# Patient Record
Sex: Male | Born: 1985 | Race: Black or African American | Hispanic: No | Marital: Single | Smoking: Current every day smoker
Health system: Southern US, Community
[De-identification: ages and names within clinical notes are randomized; demographics above are authoritative.]

---

## 2007-08-09 ENCOUNTER — Emergency Department: Payer: Self-pay | Admitting: Emergency Medicine

## 2007-08-16 ENCOUNTER — Emergency Department (HOSPITAL_COMMUNITY): Admission: EM | Admit: 2007-08-16 | Discharge: 2007-08-16 | Payer: Self-pay | Admitting: Emergency Medicine

## 2008-10-29 ENCOUNTER — Emergency Department (HOSPITAL_COMMUNITY): Admission: EM | Admit: 2008-10-29 | Discharge: 2008-10-29 | Payer: Self-pay | Admitting: Emergency Medicine

## 2010-09-25 ENCOUNTER — Emergency Department (HOSPITAL_COMMUNITY)
Admission: EM | Admit: 2010-09-25 | Discharge: 2010-09-25 | Disposition: A | Discharge status: Home / Self Care | Payer: Worker's Compensation | Attending: Emergency Medicine | Admitting: Emergency Medicine

## 2010-09-25 DIAGNOSIS — S62639B Displaced fracture of distal phalanx of unspecified finger, initial encounter for open fracture: Secondary | ICD-10-CM | POA: Insufficient documentation

## 2010-09-25 DIAGNOSIS — Y990 Civilian activity done for income or pay: Secondary | ICD-10-CM | POA: Insufficient documentation

## 2010-09-25 DIAGNOSIS — F1210 Cannabis abuse, uncomplicated: Secondary | ICD-10-CM | POA: Insufficient documentation

## 2010-09-25 DIAGNOSIS — W230XXA Caught, crushed, jammed, or pinched between moving objects, initial encounter: Secondary | ICD-10-CM | POA: Insufficient documentation

## 2011-06-11 ENCOUNTER — Encounter (HOSPITAL_COMMUNITY): Payer: Self-pay | Admitting: Emergency Medicine

## 2011-06-11 ENCOUNTER — Emergency Department (HOSPITAL_COMMUNITY)
Admission: EM | Admit: 2011-06-11 | Discharge: 2011-06-12 | Disposition: A | Discharge status: Home / Self Care | Payer: Self-pay | Attending: Emergency Medicine | Admitting: Emergency Medicine

## 2011-06-11 DIAGNOSIS — K6289 Other specified diseases of anus and rectum: Secondary | ICD-10-CM | POA: Insufficient documentation

## 2011-06-11 DIAGNOSIS — K602 Anal fissure, unspecified: Secondary | ICD-10-CM

## 2011-06-11 DIAGNOSIS — F17200 Nicotine dependence, unspecified, uncomplicated: Secondary | ICD-10-CM | POA: Insufficient documentation

## 2011-06-11 NOTE — ED Notes (Signed)
Pt c/o rectal pain x's 3 days.  Thinks he has hemorrhoids

## 2011-06-12 ENCOUNTER — Encounter (HOSPITAL_COMMUNITY): Payer: Self-pay | Admitting: *Deleted

## 2011-06-12 NOTE — Discharge Instructions (Signed)
Anal Fissure, Adult An anal fissure is a small tear or crack in the skin around the anus. Bleeding from a fissure usually stops on its own within a few minutes. However, bleeding will often reoccur with each bowel movement until the crack heals.  CAUSES   Passing large, hard stools.   Frequent diarrheal stools.   Constipation.   Inflammatory bowel disease (Crohn's disease or ulcerative colitis).   Infections.   Anal sex.  SYMPTOMS   Small amounts of blood seen on your stools, on toilet paper, or in the toilet after a bowel movement.   Rectal bleeding.   Painful bowel movements.   Itching or irritation around the anus.  DIAGNOSIS Your caregiver will examine the anal area. An anal fissure can usually be seen with careful inspection. A rectal exam may be performed and a short tube (anoscope) may be used to examine the anal canal. TREATMENT   You may be instructed to take fiber supplements. These supplements can soften your stool to help make bowel movements easier.   Sitz baths may be recommended to help heal the tear. Do not use soap in the sitz baths.   A medicated cream or ointment may be prescribed to lessen discomfort.  HOME CARE INSTRUCTIONS   Maintain a diet high in fruits, whole grains, and vegetables. Avoid constipating foods like bananas and dairy products.   Take sitz baths as directed by your caregiver.   Drink enough fluids to keep your urine clear or pale yellow.   Only take over-the-counter or prescription medicines for pain, discomfort, or fever as directed by your caregiver. Do not take aspirin as this may increase bleeding.   Do not use ointments containing numbing medications (anesthetics) or hydrocortisone. They could slow healing.  SEEK MEDICAL CARE IF:   Your fissure is not completely healed within 3 days.   You have further bleeding.   You have a fever.   You have diarrhea mixed with blood.   You have pain.   Your problem is getting worse  rather than better.  MAKE SURE YOU:   Understand these instructions.   Will watch your condition.   Will get help right away if you are not doing well or get worse.  Document Released: 03/11/2005 Document Revised: 02/28/2011 Document Reviewed: 08/26/2010 [facility] Patient Information 2012 [place], [place].    Metamucil:  One heaping teaspoon in a glass of water three times daily.  Colace (equate stool softener):  Take one 100mg  tablet twice daily.

## 2011-06-12 NOTE — ED Notes (Signed)
Pt reports having a hard time using the bathroom x several days.  Took castro oil that his grandma gave him and has now been having diarrhea.  Reports pain with BM-last BM today.  Bleeding with BM.  Reports that he has hemorrhoids.

## 2011-06-12 NOTE — ED Provider Notes (Signed)
History     CSN: [id number]  Arrival date & time 06/11/11  2051   First MD Initiated Contact with Patient 06/12/11 0106      Chief Complaint  Patient presents with  . Rectal Pain    (Consider location/radiation/quality/duration/timing/severity/associated sxs/prior treatment) HPI Comments: Rectal pain for the past three days.  Having pain with bm's and blood when wiping.  Denies fever or chills.  No constipation.  No abd pain.  The history is provided by the patient.    History reviewed. No pertinent past medical history.  History reviewed. No pertinent past surgical history.  History reviewed. No pertinent family history.  History  Substance Use Topics  . Smoking status: Current Everyday Smoker  . Smokeless tobacco: Not on file  . Alcohol Use: Yes      Review of Systems  All other systems reviewed and are negative.    Allergies  Review of patient's allergies indicates no known allergies.  Home Medications  No current outpatient prescriptions on file.  BP 115/70  Pulse 106  Temp(Src) 99.1 F (37.3 C) (Oral)  Resp 19  SpO2 98%  Physical Exam  Nursing note and vitals reviewed. Constitutional: He is oriented to person, place, and time. He appears well-developed and well-nourished. No distress.  HENT:  Head: Normocephalic and atraumatic.  Neck: Normal range of motion. Neck supple.  Abdominal: Soft. Bowel sounds are normal. He exhibits no distension. There is no tenderness.  Genitourinary:       The rectum appears grossly normal.  I do not see an hemorrhoid or fissure.  There is no active bleeding.  Musculoskeletal: Normal range of motion.  Neurological: He is alert and oriented to person, place, and time.  Skin: Skin is warm and dry.    ED Course  Procedures (including critical care time)  Labs Reviewed - No data to display No results found.   No diagnosis found.    MDM  Most likely a fissure or tear.  Will treat with fiber, stool softeners.   If no better, needs to follow up with pcp.        Geoffery Lyons, MD 06/12/11 [phone]

## 2011-06-14 ENCOUNTER — Emergency Department (HOSPITAL_COMMUNITY): Payer: Self-pay

## 2011-06-14 ENCOUNTER — Encounter (HOSPITAL_COMMUNITY): Payer: Self-pay | Admitting: *Deleted

## 2011-06-14 ENCOUNTER — Emergency Department (HOSPITAL_COMMUNITY)
Admission: EM | Admit: 2011-06-14 | Discharge: 2011-06-15 | Disposition: A | Discharge status: Home / Self Care | Payer: Self-pay | Attending: Emergency Medicine | Admitting: Emergency Medicine

## 2011-06-14 DIAGNOSIS — Z2089 Contact with and (suspected) exposure to other communicable diseases: Secondary | ICD-10-CM | POA: Insufficient documentation

## 2011-06-14 DIAGNOSIS — K921 Melena: Secondary | ICD-10-CM | POA: Insufficient documentation

## 2011-06-14 DIAGNOSIS — K6289 Other specified diseases of anus and rectum: Secondary | ICD-10-CM | POA: Insufficient documentation

## 2011-06-14 DIAGNOSIS — Z79899 Other long term (current) drug therapy: Secondary | ICD-10-CM | POA: Insufficient documentation

## 2011-06-14 DIAGNOSIS — F17200 Nicotine dependence, unspecified, uncomplicated: Secondary | ICD-10-CM | POA: Insufficient documentation

## 2011-06-14 MED ORDER — HYDROMORPHONE HCL PF 1 MG/ML IJ SOLN
2.0000 mg | Freq: Once | INTRAMUSCULAR | Status: AC
  Administered 2011-06-14: 2 mg via INTRAMUSCULAR
  Filled 2011-06-14: qty 2

## 2011-06-14 MED ORDER — LIDOCAINE HCL 2 % EX GEL
Freq: Once | CUTANEOUS | Status: AC
  Administered 2011-06-14: 22:00:00
  Filled 2011-06-14: qty 10

## 2011-06-14 NOTE — ED Notes (Signed)
Pt presents with rectal pain. Pt states has seen blood in his stool before, however has not had a BM in 4 days due to pain and fear of increased pain. Pt lying on abd on stretcher for comfort. Pt unwilling to let RN examine/assess rectum. Unknown external Hemorids.  Pt continues to moan with pain and is very restless at this time. EDP aware.

## 2011-06-14 NOTE — ED Notes (Addendum)
Rectal pain,Seen at ER at [facility].  Wants to be checked for scabies

## 2011-06-15 MED ORDER — PERMETHRIN 5 % EX CREA: TOPICAL_CREAM | CUTANEOUS | Status: AC

## 2011-06-15 MED ORDER — DOCUSATE SODIUM 100 MG PO CAPS: 100.0000 mg | ORAL_CAPSULE | Freq: Two times a day (BID) | ORAL | Status: AC

## 2011-06-15 MED ORDER — HYDROCODONE-ACETAMINOPHEN 5-325 MG PO TABS: 1.0000 | ORAL_TABLET | ORAL | Status: AC | PRN

## 2011-06-15 NOTE — Discharge Instructions (Signed)
You may use the medicines prescribed for your pain as discussed.  Also,  Use the permethrin cream as discussed for your exposure to scabies.  Call Dr Margo Aye for an office visit recheck if your symptoms are not improved over the next several days,  And to establish care with a primary doctor.  Do not drive within  4 hours of taking hydrocodone as this medicine will make you sleepy.

## 2011-06-17 NOTE — ED Provider Notes (Signed)
History     CSN: [id number]  Arrival date & time 06/14/11  2102   First MD Initiated Contact with Patient 06/14/11 2108      Chief Complaint  Patient presents with  . Rectal Pain    (Consider location/radiation/quality/duration/timing/severity/associated sxs/prior treatment) HPI Comments: He presents for pain management of his rectum.  He was seen several days ago at [facility] for the same symptoms which include pain and occasional bleeding with stools.  He has been afraid to have a bm for the past 4 days secondary to pain.  He was placed on fiber and stool softeners,  But has not taken.  He also reports was exposed to scabies last weekend while staying with relatives and would like treatment for this.  He denies rash.    Patient is a 26 y.o. male presenting with hematochezia. The history is provided by the patient.  Rectal Bleeding  The current episode started 5 to 7 days ago. The stool is described as streaked with blood. There was no prior successful therapy. Associated symptoms include rectal pain. Pertinent negatives include no anorexia, no fever, no abdominal pain, no diarrhea, no hematemesis, no nausea, no vomiting, no hematuria, no chest pain, no headaches and no rash. He has been eating and drinking normally. His past medical history does not include abdominal surgery, inflammatory bowel disease or recent abdominal injury. Recently, medical care has been given at another facility.    History reviewed. No pertinent past medical history.  History reviewed. No pertinent past surgical history.  History reviewed. No pertinent family history.  History  Substance Use Topics  . Smoking status: Current Everyday Smoker  . Smokeless tobacco: Not on file  . Alcohol Use: Yes      Review of Systems  Constitutional: Negative for fever.  HENT: Negative for congestion, sore throat and neck pain.   Eyes: Negative.   Respiratory: Negative for chest tightness and shortness of breath.     Cardiovascular: Negative for chest pain.  Gastrointestinal: Positive for blood in stool, hematochezia and rectal pain. Negative for nausea, vomiting, abdominal pain, diarrhea, anorexia and hematemesis.  Genitourinary: Negative.  Negative for hematuria, flank pain, difficulty urinating and penile pain.  Musculoskeletal: Negative for joint swelling and arthralgias.  Skin: Negative.  Negative for rash and wound.  Neurological: Negative for dizziness, weakness, light-headedness, numbness and headaches.  Hematological: Negative.   Psychiatric/Behavioral: Negative.     Allergies  Review of patient's allergies indicates no known allergies.  Home Medications   Current Outpatient Rx  Name Route Sig Dispense Refill  . DOCUSATE SODIUM 100 MG PO CAPS Oral Take 1 capsule (100 mg total) by mouth every 12 (twelve) hours. 60 capsule 0  . HYDROCODONE-ACETAMINOPHEN 5-325 MG PO TABS Oral Take 1 tablet by mouth every 4 (four) hours as needed for pain. 15 tablet 0  . PERMETHRIN 5 % EX CREA  Apply to affected area once 60 g 1    BP 104/76  Pulse 124  Temp(Src) 100.6 F (38.1 C) (Oral)  Resp 20  Ht 6\' 1"  (1.854 m)  Wt 160 lb (72.576 kg)  BMI 21.11 kg/m2  SpO2 99%  Physical Exam  Nursing note and vitals reviewed. Constitutional: He is oriented to person, place, and time. He appears well-developed and well-nourished.       Uncomfortable appearing.  HENT:  Head: Normocephalic and atraumatic.  Eyes: Conjunctivae are normal.  Neck: Normal range of motion.  Cardiovascular: Normal rate, regular rhythm, normal heart sounds and intact distal  pulses.   Pulmonary/Chest: Effort normal and breath sounds normal. He has no wheezes.  Abdominal: Soft. Bowel sounds are normal. There is no tenderness.  Genitourinary: Rectal exam shows tenderness. Rectal exam shows no external hemorrhoid and no fissure.       Patient exquisitely ttp with even light touch to perirectal area.  No edema,  Erythema,  Induration,   Lesions,  No external hemorrhoid,  Normal appearance of buttocks and anus.  Musculoskeletal: Normal range of motion.  Neurological: He is alert and oriented to person, place, and time.  Skin: Skin is warm and dry.  Psychiatric: He has a normal mood and affect.    ED Course  Procedures (including critical care time)  Labs Reviewed - No data to display No results found.   1. Rectal pain     Given dilaudid 1 mg IM,  Pt with decreased pain, but still unable to perform digital rectal exam secondary to pain.  Topical lidocaine gel applied with no improvement.  MDM  Rectal pain with unclear etiology,  Possible anal fissure or tenesmus.  Encouraged stool softeners,  Prescribed hydrocodone for pain.  Sitz baths.  Also prescribed permethrin due to scabies exposure.  F/u pcp.        Candis Musa, PA 06/17/11 [phone]

## 2011-06-19 NOTE — ED Provider Notes (Signed)
Medical screening examination/treatment/procedure(s) were conducted as a shared visit with non-physician practitioner(s) and myself.  I personally evaluated the patient during the encounter.  No acute abdomen. No visible soft tissue abscess. Could have a rectal fissure. X-ray does not show obvious fecal impaction. Will recheck tomorrow.  Donnetta Hutching, MD 06/19/11 [phone]

## 2015-06-01 ENCOUNTER — Emergency Department
Admission: EM | Admit: 2015-06-01 | Discharge: 2015-06-01 | Disposition: A | Discharge status: Home / Self Care | Payer: Self-pay | Attending: Emergency Medicine | Admitting: Emergency Medicine

## 2015-06-01 ENCOUNTER — Encounter: Payer: Self-pay | Admitting: *Deleted

## 2015-06-01 ENCOUNTER — Emergency Department: Payer: Self-pay

## 2015-06-01 DIAGNOSIS — M940 Chondrocostal junction syndrome [Tietze]: Secondary | ICD-10-CM | POA: Insufficient documentation

## 2015-06-01 DIAGNOSIS — R6889 Other general symptoms and signs: Secondary | ICD-10-CM

## 2015-06-01 DIAGNOSIS — R109 Unspecified abdominal pain: Secondary | ICD-10-CM | POA: Insufficient documentation

## 2015-06-01 DIAGNOSIS — R030 Elevated blood-pressure reading, without diagnosis of hypertension: Secondary | ICD-10-CM | POA: Insufficient documentation

## 2015-06-01 DIAGNOSIS — F17210 Nicotine dependence, cigarettes, uncomplicated: Secondary | ICD-10-CM | POA: Insufficient documentation

## 2015-06-01 LAB — URINALYSIS COMPLETE WITH MICROSCOPIC (ARMC ONLY)
BACTERIA UA: NONE SEEN
Bilirubin Urine: NEGATIVE
GLUCOSE, UA: NEGATIVE mg/dL
Leukocytes, UA: NEGATIVE
Nitrite: NEGATIVE
PROTEIN: NEGATIVE mg/dL
SQUAMOUS EPITHELIAL / LPF: NONE SEEN
Specific Gravity, Urine: 1.028 (ref 1.005–1.030)
pH: 5 (ref 5.0–8.0)

## 2015-06-01 MED ORDER — KETOROLAC TROMETHAMINE 60 MG/2ML IM SOLN
60.0000 mg | Freq: Once | INTRAMUSCULAR | Status: AC
  Administered 2015-06-01: 60 mg via INTRAMUSCULAR
  Filled 2015-06-01: qty 2

## 2015-06-01 MED ORDER — PSEUDOEPH-BROMPHEN-DM 30-2-10 MG/5ML PO SYRP: 5.0000 mL | ORAL_SOLUTION | Freq: Four times a day (QID) | ORAL | Status: DC | PRN

## 2015-06-01 MED ORDER — NAPROXEN 500 MG PO TABS: 500.0000 mg | ORAL_TABLET | Freq: Two times a day (BID) | ORAL | Status: DC
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
## 2015-06-01 NOTE — ED Notes (Signed)
See triage note.  States he has been having cough and nasal congestion for several days  Also has been having intermittent pain to left lateral rib area. Denies any injury but state pain increased with movement. States he has not tried any OTC meds for pain

## 2015-06-01 NOTE — Discharge Instructions (Signed)
Costochondritis  Costochondritis is a condition in which the tissue (cartilage) that connects your ribs with your breastbone (sternum) becomes irritated. It causes pain in the chest and rib area. It usually goes away on its own over time.  HOME CARE  · Avoid activities that wear you out.  · Do not strain your ribs. Avoid activities that use your:    Chest.    Belly.    Side muscles.  · Put ice on the area for the first 2 days after the pain starts.    Put ice in a plastic bag.    Place a towel between your skin and the bag.    Leave the ice on for 20 minutes, 2-3 times a day.  · Only take medicine as told by your doctor.  GET HELP IF:  · You have redness or puffiness (swelling) in the rib area.  · Your pain does not go away with rest or medicine.  GET HELP RIGHT AWAY IF:   · Your pain gets worse.  · You are very uncomfortable.  · You have trouble breathing.  · You cough up blood.  · You start sweating or throwing up (vomiting).  · You have a fever or lasting symptoms for more than 2-3 days.  · You have a fever and your symptoms suddenly get worse.  MAKE SURE YOU:   · Understand these instructions.  · Will watch your condition.  · Will get help right away if you are not doing well or get worse.     This information is not intended to replace advice given to you by your health care provider. Make sure you discuss any questions you have with your health care provider.     Document Released: 08/28/2007 Document Revised: 11/11/2012 Document Reviewed: 10/13/2012  Elsevier Interactive Patient Education ©2016 [employer]

## 2015-06-01 NOTE — ED Notes (Signed)
Pt reports cough, and congestion starting Sunday

## 2016-05-10 ENCOUNTER — Emergency Department (HOSPITAL_COMMUNITY): Payer: No Typology Code available for payment source

## 2016-05-10 ENCOUNTER — Encounter (HOSPITAL_COMMUNITY): Payer: Self-pay | Admitting: Emergency Medicine

## 2016-05-10 ENCOUNTER — Emergency Department (HOSPITAL_COMMUNITY)
Admission: EM | Admit: 2016-05-10 | Discharge: 2016-05-10 | Disposition: A | Discharge status: Home / Self Care | Payer: No Typology Code available for payment source | Attending: Emergency Medicine | Admitting: Emergency Medicine

## 2016-05-10 DIAGNOSIS — M545 Low back pain, unspecified: Secondary | ICD-10-CM

## 2016-05-10 DIAGNOSIS — Y92410 Unspecified street and highway as the place of occurrence of the external cause: Secondary | ICD-10-CM | POA: Insufficient documentation

## 2016-05-10 DIAGNOSIS — Y9389 Activity, other specified: Secondary | ICD-10-CM | POA: Diagnosis not present

## 2016-05-10 DIAGNOSIS — Y999 Unspecified external cause status: Secondary | ICD-10-CM | POA: Diagnosis not present

## 2016-05-10 DIAGNOSIS — S4992XA Unspecified injury of left shoulder and upper arm, initial encounter: Secondary | ICD-10-CM | POA: Diagnosis present

## 2016-05-10 DIAGNOSIS — M25512 Pain in left shoulder: Secondary | ICD-10-CM | POA: Diagnosis not present

## 2016-05-10 DIAGNOSIS — F17210 Nicotine dependence, cigarettes, uncomplicated: Secondary | ICD-10-CM | POA: Diagnosis not present

## 2016-05-10 MED ORDER — NAPROXEN 500 MG PO TABS: 500.0000 mg | ORAL_TABLET | Freq: Two times a day (BID) | ORAL | 0 refills | Status: DC

## 2016-05-10 MED ORDER — CYCLOBENZAPRINE HCL 5 MG PO TABS: 5.0000 mg | ORAL_TABLET | Freq: Three times a day (TID) | ORAL | 0 refills | Status: AC | PRN

## 2016-05-10 MED ORDER — KETOROLAC TROMETHAMINE 60 MG/2ML IM SOLN
60.0000 mg | Freq: Once | INTRAMUSCULAR | Status: AC
  Administered 2016-05-10: 60 mg via INTRAMUSCULAR
  Filled 2016-05-10: qty 2
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
## 2016-05-15 ENCOUNTER — Telehealth: Payer: Self-pay | Admitting: Orthopedic Surgery
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
## 2017-10-07 ENCOUNTER — Emergency Department: Payer: Self-pay

## 2017-10-07 ENCOUNTER — Other Ambulatory Visit: Payer: Self-pay

## 2017-10-07 ENCOUNTER — Emergency Department
Admission: EM | Admit: 2017-10-07 | Discharge: 2017-10-07 | Disposition: A | Discharge status: Home / Self Care | Payer: Self-pay | Attending: Emergency Medicine | Admitting: Emergency Medicine

## 2017-10-07 DIAGNOSIS — R112 Nausea with vomiting, unspecified: Secondary | ICD-10-CM | POA: Insufficient documentation

## 2017-10-07 DIAGNOSIS — R1030 Lower abdominal pain, unspecified: Secondary | ICD-10-CM | POA: Insufficient documentation

## 2017-10-07 DIAGNOSIS — F1210 Cannabis abuse, uncomplicated: Secondary | ICD-10-CM | POA: Insufficient documentation

## 2017-10-07 DIAGNOSIS — R197 Diarrhea, unspecified: Secondary | ICD-10-CM | POA: Insufficient documentation

## 2017-10-07 DIAGNOSIS — F17210 Nicotine dependence, cigarettes, uncomplicated: Secondary | ICD-10-CM | POA: Insufficient documentation

## 2017-10-07 LAB — COMPREHENSIVE METABOLIC PANEL
ALBUMIN: 4.3 g/dL (ref 3.5–5.0)
ALT: 18 U/L (ref 0–44)
ANION GAP: 12 (ref 5–15)
AST: 36 U/L (ref 15–41)
Alkaline Phosphatase: 38 U/L (ref 38–126)
BUN: 15 mg/dL (ref 6–20)
CO2: 19 mmol/L — ABNORMAL LOW (ref 22–32)
Calcium: 9.9 mg/dL (ref 8.9–10.3)
Chloride: 109 mmol/L (ref 98–111)
Creatinine, Ser: 1.08 mg/dL (ref 0.61–1.24)
GFR calc Af Amer: >60 mL/min (ref >60)
GFR calc non Af Amer: >60 mL/min (ref >60)
GLUCOSE: 123 mg/dL — AB (ref 70–99)
POTASSIUM: 4.5 mmol/L (ref 3.5–5.1)
SODIUM: 140 mmol/L (ref 135–145)
TOTAL PROTEIN: 7 g/dL (ref 6.5–8.1)
Total Bilirubin: 0.9 mg/dL (ref 0.3–1.2)

## 2017-10-07 LAB — URINALYSIS, COMPLETE (UACMP) WITH MICROSCOPIC
BACTERIA UA: NONE SEEN
Bilirubin Urine: NEGATIVE
Glucose, UA: NEGATIVE mg/dL
HGB URINE DIPSTICK: NEGATIVE
Ketones, ur: 5 mg/dL — AB
Leukocytes, UA: NEGATIVE
NITRITE: NEGATIVE
PROTEIN: NEGATIVE mg/dL
Specific Gravity, Urine: 1.026 (ref 1.005–1.030)
pH: 5 (ref 5.0–8.0)

## 2017-10-07 LAB — CBC
HCT: 44.6 % (ref 40.0–52.0)
HEMOGLOBIN: 14.5 g/dL (ref 13.0–18.0)
MCH: 28.7 pg (ref 26.0–34.0)
MCHC: 32.5 g/dL (ref 32.0–36.0)
MCV: 88.1 fL (ref 80.0–100.0)
Platelets: 276 10*3/uL (ref 150–440)
RBC: 5.07 MIL/uL (ref 4.40–5.90)
RDW: 15.6 % — AB (ref 11.5–14.5)
WBC: 20.7 10*3/uL — ABNORMAL HIGH (ref 3.8–10.6)

## 2017-10-07 LAB — LIPASE, BLOOD: LIPASE: 42 U/L (ref 11–51)

## 2017-10-07 MED ORDER — ONDANSETRON 4 MG PO TBDP: 4.0000 mg | ORAL_TABLET | Freq: Three times a day (TID) | ORAL | 0 refills | Status: AC | PRN

## 2017-10-07 MED ORDER — ONDANSETRON HCL 4 MG/2ML IJ SOLN
4.0000 mg | Freq: Once | INTRAMUSCULAR | Status: AC
  Administered 2017-10-07: 4 mg via INTRAVENOUS
  Filled 2017-10-07: qty 2

## 2017-10-07 MED ORDER — LOPERAMIDE HCL 2 MG PO TABS: 2.0000 mg | ORAL_TABLET | Freq: Four times a day (QID) | ORAL | 0 refills | Status: AC | PRN

## 2017-10-07 MED ORDER — SODIUM CHLORIDE 0.9 % IV BOLUS
1000.0000 mL | Freq: Once | INTRAVENOUS | Status: AC
  Administered 2017-10-07: 1000 mL via INTRAVENOUS

## 2017-10-07 MED ORDER — IOPAMIDOL (ISOVUE-300) INJECTION 61%
30.0000 mL | Freq: Once | INTRAVENOUS | Status: AC | PRN
  Administered 2017-10-07: 30 mL via ORAL
  Filled 2017-10-07: qty 30

## 2017-10-07 MED ORDER — IOHEXOL 300 MG/ML  SOLN
100.0000 mL | Freq: Once | INTRAMUSCULAR | Status: AC | PRN
  Administered 2017-10-07: 100 mL via INTRAVENOUS
  Filled 2017-10-07: qty 100

## 2017-10-07 NOTE — ED Notes (Signed)
Pt drinking oral CT contrast

## 2017-10-07 NOTE — Discharge Instructions (Addendum)
Please take medications as needed, as prescribed.  Return to the emergency department for any fever or increased abdominal pain.  As we discussed your CT scan does show a nodule in the left lower part of your lungs.  We recommend following up with a primary care doctor to repeat a chest CT scan in 6 to 12 months to ensure that it has not gotten bigger and it has resolved.

## 2017-10-07 NOTE — ED Provider Notes (Signed)
[facility] Emergency Department Provider Note  Time seen: 2:52 PM  I have reviewed the triage vital signs and the nursing notes.   HISTORY  Chief Complaint Abdominal Pain and Emesis    HPI Bryan Silva is a 32 y.o. male with no past medical history presents to the emergency department for abdominal pain nausea vomiting diarrhea.  According to the patient this morning he had several episodes of loose stool then developed moderate to significant lower abdominal pain in the mid abdomen.  States he has been vomiting this morning as well with nausea.  Upon arrival to the emergency department patient was given Zofran and states he feels much better.  Continues to have mild to moderate pain across the lower abdomen.  Denies any dysuria or hematuria.  No history of abdominal surgeries in the past.   History reviewed. No pertinent past medical history.  There are no active problems to display for this patient.   History reviewed. No pertinent surgical history.  Prior to Admission medications   Medication Sig Start Date End Date Taking? Authorizing Provider  cyclobenzaprine (FLEXERIL) 5 MG tablet Take 1 tablet (5 mg total) by mouth 3 (three) times daily as needed for muscle spasms. 05/10/16   Burgess AmorIdol, Julie, PA-C  naproxen (NAPROSYN) 500 MG tablet Take 1 tablet (500 mg total) by mouth 2 (two) times daily. 05/10/16   Burgess AmorIdol, Julie, PA-C    No Known Allergies  No family history on file.  Social History Social History   Tobacco Use  . Smoking status: Current Every Day Smoker    Packs/day: 0.50    Types: Cigarettes  . Smokeless tobacco: Never Used  Substance Use Topics  . Alcohol use: Yes  . Drug use: Yes    Types: Marijuana    Comment: every day    Review of Systems Constitutional: Negative for fever. Cardiovascular: Negative for chest pain. Respiratory: Negative for shortness of breath. Gastrointestinal: Moderate lower abdominal pain, positive nausea  vomiting diarrhea. Genitourinary: Negative for urinary compaints.  Negative for dysuria or hematuria. Musculoskeletal: Negative for musculoskeletal complaints Skin: Negative for skin complaints  Neurological: Negative for headache All other ROS negative  ____________________________________________   PHYSICAL EXAM:  VITAL SIGNS: ED Triage Vitals  Enc Vitals Group     BP 10/07/17 1318 (!) 176/127     Pulse Rate 10/07/17 1318 70     Resp 10/07/17 1318 20     Temp 10/07/17 1318 98 F (36.7 C)     Temp Source 10/07/17 1318 Oral     SpO2 10/07/17 1318 100 %     Weight 10/07/17 1320 145 lb (65.8 kg)     Height 10/07/17 1320 5\' 6"  (1.676 m)     Head Circumference --      Peak Flow --      Pain Score 10/07/17 1319 10     Pain Loc --      Pain Edu? --      Excl. in GC? --    Constitutional: Alert and oriented. Well appearing and in no distress. Eyes: Normal exam ENT   Head: Normocephalic and atraumatic      Mouth/Throat: Mucous membranes are moist. Cardiovascular: Normal rate, regular rhythm.  Respiratory: Normal respiratory effort without tachypnea nor retractions. Breath sounds are clear Gastrointestinal: Soft, moderate suprapubic tenderness to palpation.  No rebound or guarding.  No distention. Musculoskeletal: Nontender with normal range of motion in all extremities. Neurologic:  Normal speech and language. No gross focal  neurologic deficits  Skin:  Skin is warm, dry and intact.  Psychiatric: Mood and affect are normal.   ____________________________________________     RADIOLOGY  CT scan is negative for acute abnormality.  There is a 2 x 1.4 cm nodule in the left lower lung. ____________________________________________   INITIAL IMPRESSION / ASSESSMENT AND PLAN / ED COURSE  Pertinent labs & imaging results that were available during my care of the patient were reviewed by me and considered in my medical decision making (see chart for details).  Patient  presents to the emergency department for lower abdominal pain, nausea vomiting diarrhea this morning.  Denies any history of similar symptoms.  States he had chills this morning but denies any known fever.  Patient states he is feeling better after getting Zofran in the emergency department.  Differential would include gastroenteritis, appendicitis, colitis or diverticulitis, urinary tract infection.  Urinalysis pending, we will proceed with a CT scan of the abdomen/pelvis given his significant leukocytosis.  Patient agreeable to this plan of care.  We will continue with IV hydration, patient does not wish for anything for pain at this time.  CT scan negative for acute abnormality.  Highly suspect gastroenteritis.  Patient states he is feeling much better after Zofran.  We will discharge patient with nausea medication, loperamide if needed and PCP follow-up.  Patient agreeable to this plan of care.  I did discuss with the patient his 2 x 1.4 cm opacity in the left lower lung.  Although likely inflammatory cannot rule out adenocarcinoma and will need to have a repeat CT scan in 6 to 12 months.  Patient agreeable to plan.  ____________________________________________   FINAL CLINICAL IMPRESSION(S) / ED DIAGNOSES  Lower abdominal pain Nausea vomiting diarrhea Gastroenteritis   Minna Antis, MD 10/07/17 1620

## 2017-10-07 NOTE — ED Triage Notes (Signed)
Pt started with diarrhea this am and c/o abd pain - he then started with vomiting/dry heaves

## 2017-11-11 ENCOUNTER — Other Ambulatory Visit: Payer: Self-pay

## 2017-11-11 ENCOUNTER — Emergency Department
Admission: EM | Admit: 2017-11-11 | Discharge: 2017-11-11 | Disposition: A | Discharge status: Home / Self Care | Payer: Self-pay | Attending: Emergency Medicine | Admitting: Emergency Medicine

## 2017-11-11 ENCOUNTER — Encounter: Payer: Self-pay | Admitting: Emergency Medicine

## 2017-11-11 DIAGNOSIS — K047 Periapical abscess without sinus: Secondary | ICD-10-CM | POA: Insufficient documentation

## 2017-11-11 DIAGNOSIS — F17210 Nicotine dependence, cigarettes, uncomplicated: Secondary | ICD-10-CM | POA: Insufficient documentation

## 2017-11-11 MED ORDER — CEFTRIAXONE SODIUM 1 G IJ SOLR
1.0000 g | Freq: Once | INTRAMUSCULAR | Status: AC
  Administered 2017-11-11: 1 g via INTRAMUSCULAR
  Filled 2017-11-11: qty 10

## 2017-11-11 MED ORDER — NAPROXEN 500 MG PO TABS: 500.0000 mg | ORAL_TABLET | Freq: Two times a day (BID) | ORAL | 0 refills | Status: AC

## 2017-11-11 MED ORDER — OXYCODONE-ACETAMINOPHEN 5-325 MG PO TABS
1.0000 | ORAL_TABLET | Freq: Once | ORAL | Status: AC
  Administered 2017-11-11: 1 via ORAL

## 2017-11-11 MED ORDER — AMOXICILLIN-POT CLAVULANATE 875-125 MG PO TABS: 1.0000 | ORAL_TABLET | Freq: Two times a day (BID) | ORAL | 0 refills | Status: AC

## 2017-11-11 MED ORDER — NAPROXEN 500 MG PO TABS: 500.0000 mg | ORAL_TABLET | Freq: Two times a day (BID) | ORAL | 0 refills | Status: DC

## 2017-11-11 MED ORDER — OXYCODONE-ACETAMINOPHEN 5-325 MG PO TABS
ORAL_TABLET | ORAL | Status: AC
  Filled 2017-11-11: qty 1

## 2017-11-11 MED ORDER — LIDOCAINE HCL (PF) 1 % IJ SOLN
INTRAMUSCULAR | Status: AC
  Administered 2017-11-11: 2.1 mL
  Filled 2017-11-11: qty 5

## 2017-11-11 NOTE — ED Notes (Signed)
First Nurse Note:  Patient has swelling of right side of face, states it started yesterday, complaining of pain left upper jaw.  States he has a cavity in top left molar.

## 2017-11-11 NOTE — Discharge Instructions (Addendum)
OPTIONS FOR DENTAL FOLLOW UP CARE ° °[place] [facility] - Local Safety Net Dental Clinics °http://www.ncdhhs.gov/dph/oralhealth/services/safetynetclinics.htm °  °[facility] (336-562-3123) ° °[facility] [place] (919-933-9087) ° °[facility] ([phone] ext [phone]) ° °[facility] ([phone]) ° °[facility] ([phone]) °This clinic caters to the indigent population and is on a lottery system. °Location: °[facility], [address], [place] °Clinic Hours: °Wednesdays from 6pm - 9pm, patients seen by a lottery system. °For dates, call or go to www.med.[facility].edu/[facility]/patients/Dental-[facility] °Services: °Cleanings, fillings and simple extractions. °Payment Options: °DENTAL WORK IS FREE OF CHARGE. Bring proof of income or support. °Best way to get seen: °Arrive at 5:15 pm - this is a lottery, NOT first come/first serve, so arriving earlier will not increase your chances of being seen. °  °  °[facility] °919-537-3737 °Select option 1 for emergencies °  °Location: °[facility], [address], [place] °Clinic Hours: °No walk-ins accepted - call the day before to schedule an appointment. °Check in times are 9:30 am and 1:30 pm. °Services: °Simple extractions, temporary fillings, pulpectomy/pulp debridement, uncomplicated abscess drainage. °Payment Options: °PAYMENT IS DUE AT THE TIME OF SERVICE.  Fee is usually $100-200, additional surgical procedures (e.g. abscess drainage) may be extra. °Cash, checks, Visa/MasterCard accepted.  Can file Medicaid if patient is covered for dental - patient should call case worker to check. °No discount for [facility] patients. °Best way to get seen: °MUST call the day before and get onto the schedule. Can usually be seen the next 1-2 days. No walk-ins accepted. °  °  °[facility] °[phone] °   °Location: °[facility], [address], [place] °Clinic Hours: °M, W, Th, F 8am or 1:30pm, Tues 9a or 1:30 - first come/first served. °Services: °Simple extractions, temporary fillings, uncomplicated abscess drainage.  You do not need to be an [place] resident. °Payment Options: °PAYMENT IS DUE AT THE TIME OF SERVICE. °Dental insurance, otherwise sliding scale - bring proof of income or support. °Depending on income and treatment needed, cost is usually $50-200. °Best way to get seen: °Arrive early as it is first come/first served. °  °  °[facility] °[phone] °  °Location: °[address] °Clinic Hours: °Mon-Thu 8a-5p °Services: °Most basic dental services including extractions and fillings. °Payment Options: °PAYMENT IS DUE AT THE TIME OF SERVICE. °Sliding scale, up to 50% off - bring proof if income or support. °Medicaid with dental option accepted. °Best way to get seen: °Call to schedule an appointment, can usually be seen within 2 weeks OR they will try to see walk-ins - show up at 8a or 2p (you may have to wait). °  °  °[facility] °919-245-2435 °[place] RESIDENTS ONLY °  °Location: °[facility], [address], [place], [place] [address] °Clinic Hours: By appointment only. °Monday - Thursday 8am-5pm, Friday 8am-12pm °Services: Cleanings, fillings, extractions. °Payment Options: °PAYMENT IS DUE AT THE TIME OF SERVICE. °Cash, Visa or MasterCard. Sliding scale - $30 minimum per service. °Best way to get seen: °Come in to office, complete packet and make an appointment - need proof of income °or support monies for each household member and proof of [place] residence. °Usually takes about a month to get in. °  °  °[facility] °[phone] °  °Location: °[address]   [place] °Clinic Hours: Walk-in Urgent Care Dental Services are offered Monday-Friday  mornings only. °The numbers of emergencies accepted daily is limited to the number of °providers available. °Maximum 15 - Mondays, Wednesdays & Thursdays °Maximum 10 - Tuesdays & Fridays °Services: °You do not need to be a [place] resident to be seen for a dental emergency. °Emergencies are defined as pain, swelling, abnormal bleeding, or dental trauma. Walkins will receive x-rays if needed. °NOTE: Dental cleaning is not an emergency. °Payment Options: °PAYMENT IS DUE AT THE TIME OF SERVICE. °Minimum co-pay is $40.00 for uninsured patients. °Minimum co-pay is $3.00 for Medicaid with dental coverage. °Dental Insurance is accepted and must be presented at time of visit. °Medicare does not cover dental. °Forms of payment: Cash, credit card, checks. °Best way to get seen: °If not previously registered with the clinic, walk-in dental registration begins at 7:15 am and is on a first come/first serve basis. °If previously registered with the clinic, call to make an appointment. °  °  °[facility] °919-776-4359 °[place] RESIDENTS ONLY °  °Location: °[address], [place], [place] °Clinic Hours: °Mon-Thu 10a-2p °Services: Extractions only! °Payment Options: °FREE (donations accepted) - bring proof of income or support °Best way to get seen: °Call and schedule an appointment OR come at 8am on the 1st Monday of every month (except for holidays) when it is first come/first served. °  °  °[facility] °[phone] °  °Location: °[address], [place] °Clinic Hours: °Friday mornings °Services, Payment Options, Best way to get seen: °Call for info °

## 2017-11-11 NOTE — ED Triage Notes (Signed)
Pt reports infection to upper left tooth with swelling that started yesterday.

## 2017-11-11 NOTE — ED Provider Notes (Signed)
[facility] Emergency Department Provider Note  ____________________________________________  Time seen: Approximately 8:42 AM  I have reviewed the triage vital signs and the nursing notes.   HISTORY  Chief Complaint Dental Pain and Oral Swelling    HPI Bryan BillingsMarterian A Tapanes is a 32 y.o. male that presents emergency department for evaluation of right-sided dental pain for 1 day.  Patient states that he has a large hole in a top back tooth.  No fever, chills.   History reviewed. No pertinent past medical history.  There are no active problems to display for this patient.   History reviewed. No pertinent surgical history.  Prior to Admission medications   Medication Sig Start Date End Date Taking? Authorizing Provider  amoxicillin-clavulanate (AUGMENTIN) 875-125 MG tablet Take 1 tablet by mouth 2 (two) times daily for 10 days. 11/11/17 11/21/17  Enid DerryWagner, Tyiana Hill, PA-C  cyclobenzaprine (FLEXERIL) 5 MG tablet Take 1 tablet (5 mg total) by mouth 3 (three) times daily as needed for muscle spasms. 05/10/16   Burgess AmorIdol, Julie, PA-C  loperamide (IMODIUM A-D) 2 MG tablet Take 1 tablet (2 mg total) by mouth 4 (four) times daily as needed for diarrhea or loose stools. 10/07/17   Minna AntisPaduchowski, Kevin, MD  naproxen (NAPROSYN) 500 MG tablet Take 1 tablet (500 mg total) by mouth 2 (two) times daily with a meal. 11/11/17 11/11/18  Enid DerryWagner, Morad Tal, PA-C  ondansetron (ZOFRAN ODT) 4 MG disintegrating tablet Take 1 tablet (4 mg total) by mouth every 8 (eight) hours as needed for nausea or vomiting. 10/07/17   Minna AntisPaduchowski, Kevin, MD    Allergies Patient has no known allergies.  No family history on file.  Social History Social History   Tobacco Use  . Smoking status: Current Every Day Smoker    Packs/day: 0.50    Types: Cigarettes  . Smokeless tobacco: Never Used  Substance Use Topics  . Alcohol use: Yes  . Drug use: Yes    Types: Marijuana    Comment: every day     Review of  Systems  Constitutional: No fever/chills ENT: No upper respiratory complaints. Gastrointestinal: No nausea, no vomiting.  Musculoskeletal: Negative for musculoskeletal pain. Skin: Negative for rash, abrasions, lacerations, ecchymosis.   ____________________________________________   PHYSICAL EXAM:  VITAL SIGNS: ED Triage Vitals [11/11/17 0830]  Enc Vitals Group     BP 121/79     Pulse Rate 70     Resp 20     Temp 98.6 F (37 C)     Temp Source Oral     SpO2 100 %     Weight 150 lb (68 kg)     Height 6' (1.829 m)     Head Circumference      Peak Flow      Pain Score 9     Pain Loc      Pain Edu?      Excl. in GC?      Constitutional: Alert and oriented. Well appearing and in no acute distress. Eyes: Conjunctivae are normal. PERRL. EOMI. Head: Atraumatic. ENT:      Ears:      Nose: No congestion/rhinnorhea.      Mouth/Throat: Mucous membranes are moist.  Mild swelling to right cheek.  Large cavity to tooth #3 with surrounding tenderness to palpation. Neck: No stridor.   Cardiovascular: Normal rate, regular rhythm.  Good peripheral circulation. Respiratory: Normal respiratory effort without tachypnea or retractions. Lungs CTAB. Good air entry to the bases with no decreased or absent breath  sounds. Musculoskeletal: Full range of motion to all extremities. No gross deformities appreciated. Neurologic:  Normal speech and language. No gross focal neurologic deficits are appreciated.  Skin:  Skin is warm, dry and intact. No rash noted. Psychiatric: Mood and affect are normal. Speech and behavior are normal. Patient exhibits appropriate insight and judgement.   ____________________________________________   LABS (all labs ordered are listed, but only abnormal results are displayed)  Labs Reviewed - No data to display ____________________________________________  EKG   ____________________________________________  RADIOLOGY   No results  found.  ____________________________________________    PROCEDURES  Procedure(s) performed:    Procedures    Medications  cefTRIAXone (ROCEPHIN) injection 1 g (has no administration in time range)     ____________________________________________   INITIAL IMPRESSION / ASSESSMENT AND PLAN / ED COURSE  Pertinent labs & imaging results that were available during my care of the patient were reviewed by me and considered in my medical decision making (see chart for details).  Review of the [place] CSRS was performed in accordance of the [facility] prior to dispensing any controlled drugs.   Patient's diagnosis is consistent with dental abscess. Patient will be discharged home with prescriptions for Augmentin. Patient is to follow up with dentist as directed.  Dental resources were provided.  Patient is given ED precautions to return to the ED for any worsening or new symptoms.     ____________________________________________  FINAL CLINICAL IMPRESSION(S) / ED DIAGNOSES  Final diagnoses:  Dental abscess      NEW MEDICATIONS STARTED DURING THIS VISIT:  ED Discharge Orders         Ordered    amoxicillin-clavulanate (AUGMENTIN) 875-125 MG tablet  2 times daily     11/11/17 0909    naproxen (NAPROSYN) 500 MG tablet  2 times daily with meals,   Status:  Discontinued     11/11/17 0909    naproxen (NAPROSYN) 500 MG tablet  2 times daily with meals     11/11/17 0911              This chart was dictated using voice recognition software/Dragon. Despite best efforts to proofread, errors can occur which can change the meaning. Any change was purely unintentional.    Enid DerryWagner, Sahir Tolson, PA-C 11/11/17 1509    Arnaldo NatalMalinda, Paul F, MD 11/11/17 [phone]

## 2017-11-11 NOTE — ED Notes (Signed)
Says upper right tooth infected. swlling to right side of face since yesterady and pain is "agonizing."  Does not have a dentist.

## 2018-07-09 ENCOUNTER — Telehealth: Payer: Self-pay | Admitting: Family

## 2018-07-09 ENCOUNTER — Encounter: Payer: Self-pay | Admitting: Family

## 2018-07-09 DIAGNOSIS — R112 Nausea with vomiting, unspecified: Secondary | ICD-10-CM

## 2018-07-09 MED ORDER — PROMETHAZINE HCL 25 MG PO TABS: 25.0000 mg | ORAL_TABLET | Freq: Three times a day (TID) | ORAL | 0 refills | Status: AC | PRN

## 2018-07-09 NOTE — Progress Notes (Signed)
We are sorry that you are not feeling well. Here is how we plan to help!  Based on what you have shared with me it looks like you have a Virus that is irritating your GI tract.  Vomiting is the forceful emptying of a portion of the stomach's content through the mouth.  Although nausea and vomiting can make you feel miserable, it's important to remember that these are not diseases, but rather symptoms of an underlying illness.  When we treat short term symptoms, we always caution that any symptoms that persist should be fully evaluated in a medical office.  I have prescribed a medication that will help alleviate your symptoms and allow you to stay hydrated:  Promethazine 25 mg take 1 tablet twice daily  HOME CARE:  Drink clear liquids.  This is very important! Dehydration (the lack of fluid) can lead to a serious complication.  Start off with 1 tablespoon every 5 minutes for 8 hours.  You may begin eating bland foods after 8 hours without vomiting.  Start with saltine crackers, white bread, rice, mashed potatoes, applesauce.  After 48 hours on a bland diet, you may resume a normal diet.  Try to go to sleep.  Sleep often empties the stomach and relieves the need to vomit.  GET HELP RIGHT AWAY IF:   Your symptoms do not improve or worsen within 2 days after treatment.  You have a fever for over 3 days.  You cannot keep down fluids after trying the medication.  MAKE SURE YOU:   Understand these instructions.  Will watch your condition.  Will get help right away if you are not doing well or get worse.   Thank you for choosing an e-visit. Your e-visit answers were reviewed by a board certified advanced clinical practitioner to complete your personal care plan. Depending upon the condition, your plan could have included both over the counter or prescription medications. Please review your pharmacy choice. Be sure that the pharmacy you have chosen is open so that you can pick up your  prescription now.  If there is a problem you may message your provider in MyChart to have the prescription routed to another pharmacy. Your safety is important to us. If you have drug allergies check your prescription carefully.  For the next 24 hours, you can use MyChart to ask questions about today's visit, request a non-urgent call back, or ask for a work or school excuse from your e-visit provider. You will get an e-mail in the next two days asking about your experience. I hope that your e-visit has been valuable and will speed your recovery.   

## 2018-09-11 NOTE — Progress Notes (Signed)
Greater than 5 minutes, yet less than 10 minutes of time have been spent researching, coordinating, and implementing care for this patient today.  Thank you for the details you included in the comment boxes. Those details are very helpful in determining the best course of treatment for you and help us to provide the best care.  

## 2019-11-04 ENCOUNTER — Other Ambulatory Visit: Payer: Self-pay

## 2019-11-04 ENCOUNTER — Encounter (HOSPITAL_COMMUNITY): Payer: Self-pay

## 2019-11-04 ENCOUNTER — Emergency Department (HOSPITAL_COMMUNITY)
Admission: EM | Admit: 2019-11-04 | Discharge: 2019-11-04 | Disposition: A | Discharge status: Home / Self Care | Payer: HRSA Program | Attending: Emergency Medicine | Admitting: Emergency Medicine

## 2019-11-04 DIAGNOSIS — R05 Cough: Secondary | ICD-10-CM | POA: Diagnosis present

## 2019-11-04 DIAGNOSIS — Z1152 Encounter for screening for COVID-19: Secondary | ICD-10-CM | POA: Insufficient documentation

## 2019-11-04 DIAGNOSIS — R059 Cough, unspecified: Secondary | ICD-10-CM

## 2019-11-04 DIAGNOSIS — Z20822 Contact with and (suspected) exposure to covid-19: Secondary | ICD-10-CM | POA: Diagnosis not present

## 2019-11-04 LAB — SARS CORONAVIRUS 2 BY RT PCR (HOSPITAL ORDER, PERFORMED IN ~~LOC~~ HOSPITAL LAB): SARS Coronavirus 2: NEGATIVE

## 2019-11-04 NOTE — ED Notes (Signed)
Patient states he was bit by several mosquitos on Tuesday.  States he started feeling bad on Wednesday and that his work will not let him come back until he has a covid test done.

## 2019-11-04 NOTE — ED Triage Notes (Signed)
Pt presents to ED with complaints of cough, nausea and vomiting x 2 in the last 24 hours. Pt wants Covid test.

## 2019-11-04 NOTE — ED Provider Notes (Signed)
[facility] EMERGENCY DEPARTMENT Provider Note   CSN: [id number] Arrival date & time: 11/04/19  0815     History Chief Complaint  Patient presents with  . Cough    Bryan Silva is a 34 y.o. male.  HPI 34 year old male with a history of smoking presents to the ER with 2-day history of cough, nausea, nonbloody nonbilious vomiting, myalgias, chills over the last 24 hours. Patient is not vaccinated for Covid. No known sick contacts. Denies any shortness of breath or chest pain. Has felt chills and sweats, but no measurable fever. No loss of taste or smell.  Requesting Covid test. Has not taken anything for his symptoms.    History reviewed. No pertinent past medical history.  There are no problems to display for this patient.   History reviewed. No pertinent surgical history.     No family history on file.  Social History   Tobacco Use  . Smoking status: Current Every Day Smoker    Packs/day: 0.50    Types: Cigarettes  . Smokeless tobacco: Never Used  Substance Use Topics  . Alcohol use: Yes  . Drug use: Yes    Types: Marijuana    Comment: every day    Home Medications Prior to Admission medications   Medication Sig Start Date End Date Taking? Authorizing Provider  cyclobenzaprine (FLEXERIL) 5 MG tablet Take 1 tablet (5 mg total) by mouth 3 (three) times daily as needed for muscle spasms. 05/10/16   Burgess Amor, PA-C  loperamide (IMODIUM A-D) 2 MG tablet Take 1 tablet (2 mg total) by mouth 4 (four) times daily as needed for diarrhea or loose stools. 10/07/17   Minna Antis, MD  ondansetron (ZOFRAN ODT) 4 MG disintegrating tablet Take 1 tablet (4 mg total) by mouth every 8 (eight) hours as needed for nausea or vomiting. 10/07/17   Minna Antis, MD  promethazine (PHENERGAN) 25 MG tablet Take 1 tablet (25 mg total) by mouth every 8 (eight) hours as needed for nausea or vomiting. 07/09/18   Eulis Foster, FNP    Allergies    Patient has no known  allergies.  Review of Systems   Review of Systems  Constitutional: Positive for chills. Negative for fever.  Respiratory: Positive for cough. Negative for shortness of breath.   Cardiovascular: Negative for chest pain.  Gastrointestinal: Positive for nausea and vomiting. Negative for abdominal pain.  Neurological: Positive for headaches.    Physical Exam Updated Vital Signs BP 114/60 (BP Location: Right Arm)   Pulse (!) 58   Temp 98.4 F (36.9 C) (Oral)   Resp 18   Ht 6' (1.829 m)   Wt 70.3 kg   SpO2 100%   BMI 21.02 kg/m   Physical Exam Vitals and nursing note reviewed.  Constitutional:      General: He is not in acute distress.    Appearance: Normal appearance. He is well-developed. He is not ill-appearing, toxic-appearing or diaphoretic.  HENT:     Head: Normocephalic and atraumatic.     Nose: Nose normal.     Mouth/Throat:     Mouth: Mucous membranes are moist.     Pharynx: Oropharynx is clear.  Eyes:     Conjunctiva/sclera: Conjunctivae normal.  Cardiovascular:     Rate and Rhythm: Normal rate and regular rhythm.     Pulses: Normal pulses.     Heart sounds: Normal heart sounds. No murmur heard.   Pulmonary:     Effort: Pulmonary effort is normal. No respiratory  distress.     Breath sounds: Normal breath sounds.     Comments: Respirations equal and unlabored, patient able to speak in full sentences, lungs clear to auscultation bilaterally  Abdominal:     General: Abdomen is flat.     Palpations: Abdomen is soft.     Tenderness: There is no abdominal tenderness.     Comments: Abdomen soft and non tender, non-distended, no rebound or guarding.   Musculoskeletal:        General: Normal range of motion.     Cervical back: Normal range of motion and neck supple.  Skin:    General: Skin is warm and dry.  Neurological:     General: No focal deficit present.     Mental Status: He is alert and oriented to person, place, and time.     ED Results / Procedures /  Treatments   Labs (all labs ordered are listed, but only abnormal results are displayed) Labs Reviewed  SARS CORONAVIRUS 2 BY RT PCR (HOSPITAL ORDER, PERFORMED IN [facility] LAB)    EKG None  Radiology No results found.  Procedures Procedures (including critical care time)  Medications Ordered in ED Medications - No data to display  ED Course  I have reviewed the triage vital signs and the nursing notes.  Pertinent labs & imaging results that were available during my care of the patient were reviewed by me and considered in my medical decision making (see chart for details).    MDM Rules/Calculators/A&P                          Patient with myalgias, nausea, vomiting, cough x1 day. Not vaccinated for COVID. Vitals reassuring. Abdomen soft and nontender. Symptoms consistent with COVID-19 or other viral infection, Covid swab provided. Patient evaluated, tested and sent home with instructions for home care and Quarantine. Instructed to seek further care if symptoms worsen.    Final Clinical Impression(s) / ED Diagnoses Final diagnoses:  Cough  Encounter for laboratory testing for COVID-19 virus    Rx / DC Orders ED Discharge Orders    None       Mare Ferrari, PA-C 11/04/19 1046    Sabas Sous, MD 11/04/19 1536

## 2019-11-04 NOTE — Discharge Instructions (Signed)
Your Covid test results will be available on the MyChart app. Please make sure to quarantine until you receive your results. If they are positive, please make sure to quarantine for an additional 7 to 10 days. You may take over-the-counter cold and flu medications, Tylenol/ibuprofen for body aches/fevers, drink plenty of fluids. You may also buy over-the-counter pulse oximeter which can be found at your local pharmacy, please make sure to return to the ER if your oxygen levels consistently drop below 90%. Return to the ER for worsening shortness of breath or any other worsening symptoms.     Person Under Monitoring Name: Bryan Silva  Location: [address] [place] [address]   Infection Prevention Recommendations for Individuals Confirmed to have, or Being Evaluated for, 2019 Novel Coronavirus (COVID-19) Infection Who Receive Care at Home  Individuals who are confirmed to have, or are being evaluated for, COVID-19 should follow the prevention steps below until a healthcare provider or local or state health department says they can return to normal activities.  Stay home except to get medical care You should restrict activities outside your home, except for getting medical care. Do not go to work, school, or public areas, and do not use public transportation or taxis.  Call ahead before visiting your doctor Before your medical appointment, call the healthcare provider and tell them that you have, or are being evaluated for, COVID-19 infection. This will help the healthcare provider's office take steps to keep other people from getting infected. Ask your healthcare provider to call the local or state health department.  Monitor your symptoms Seek prompt medical attention if your illness is worsening (e.g., difficulty breathing). Before going to your medical appointment, call the healthcare provider and tell them that you have, or are being evaluated for, COVID-19 infection.  Ask your healthcare provider to call the local or state health department.  Wear a facemask You should wear a facemask that covers your nose and mouth when you are in the same room with other people and when you visit a healthcare provider. People who live with or visit you should also wear a facemask while they are in the same room with you.  Separate yourself from other people in your home As much as possible, you should stay in a different room from other people in your home. Also, you should use a separate bathroom, if available.  Avoid sharing household items You should not share dishes, drinking glasses, cups, eating utensils, towels, bedding, or other items with other people in your home. After using these items, you should wash them thoroughly with soap and water.  Cover your coughs and sneezes Cover your mouth and nose with a tissue when you cough or sneeze, or you can cough or sneeze into your sleeve. Throw used tissues in a lined trash can, and immediately wash your hands with soap and water for at least 20 seconds or use an alcohol-based hand rub.  Wash your [employer] your hands often and thoroughly with soap and water for at least 20 seconds. You can use an alcohol-based hand sanitizer if soap and water are not available and if your hands are not visibly dirty. Avoid touching your eyes, nose, and mouth with unwashed hands.   Prevention Steps for Caregivers and Household Members of Individuals Confirmed to have, or Being Evaluated for, COVID-19 Infection Being Cared for in the Home  If you live with, or provide care at home for, a person confirmed to have, or being  evaluated for, COVID-19 infection please follow these guidelines to prevent infection:  Follow healthcare provider's instructions Make sure that you understand and can help the patient follow any healthcare provider instructions for all care.  Provide for the patient's basic needs You should help the  patient with basic needs in the home and provide support for getting groceries, prescriptions, and other personal needs.  Monitor the patient's symptoms If they are getting sicker, call his or her medical provider and tell them that the patient has, or is being evaluated for, COVID-19 infection. This will help the healthcare provider's office take steps to keep other people from getting infected. Ask the healthcare provider to call the local or state health department.  Limit the number of people who have contact with the patient If possible, have only one caregiver for the patient. Other household members should stay in another home or place of residence. If this is not possible, they should stay in another room, or be separated from the patient as much as possible. Use a separate bathroom, if available. Restrict visitors who do not have an essential need to be in the home.  Keep older adults, very young children, and other sick people away from the patient Keep older adults, very young children, and those who have compromised immune systems or chronic health conditions away from the patient. This includes people with chronic heart, lung, or kidney conditions, diabetes, and cancer.  Ensure good ventilation Make sure that shared spaces in the home have good air flow, such as from an air conditioner or an opened window, weather permitting.  Wash your hands often Wash your hands often and thoroughly with soap and water for at least 20 seconds. You can use an alcohol based hand sanitizer if soap and water are not available and if your hands are not visibly dirty. Avoid touching your eyes, nose, and mouth with unwashed hands. Use disposable paper towels to dry your hands. If not available, use dedicated cloth towels and replace them when they become wet.  Wear a facemask and gloves Wear a disposable facemask at all times in the room and gloves when you touch or have contact with the patient's  blood, body fluids, and/or secretions or excretions, such as sweat, saliva, sputum, nasal mucus, vomit, urine, or feces.  Ensure the mask fits over your nose and mouth tightly, and do not touch it during use. Throw out disposable facemasks and gloves after using them. Do not reuse. Wash your hands immediately after removing your facemask and gloves. If your personal clothing becomes contaminated, carefully remove clothing and launder. Wash your hands after handling contaminated clothing. Place all used disposable facemasks, gloves, and other waste in a lined container before disposing them with other household waste. Remove gloves and wash your hands immediately after handling these items.  Do not share dishes, glasses, or other household items with the patient Avoid sharing household items. You should not share dishes, drinking glasses, cups, eating utensils, towels, bedding, or other items with a patient who is confirmed to have, or being evaluated for, COVID-19 infection. After the person uses these items, you should wash them thoroughly with soap and water.  Wash laundry thoroughly Immediately remove and wash clothes or bedding that have blood, body fluids, and/or secretions or excretions, such as sweat, saliva, sputum, nasal mucus, vomit, urine, or feces, on them. Wear gloves when handling laundry from the patient. Read and follow directions on labels of laundry or clothing items and detergent. In general,  wash and dry with the warmest temperatures recommended on the label.  Clean all areas the individual has used often Clean all touchable surfaces, such as counters, tabletops, doorknobs, bathroom fixtures, toilets, phones, keyboards, tablets, and bedside tables, every day. Also, clean any surfaces that may have blood, body fluids, and/or secretions or excretions on them. Wear gloves when cleaning surfaces the patient has come in contact with. Use a diluted bleach solution (e.g., dilute  bleach with 1 part bleach and 10 parts water) or a household disinfectant with a label that says EPA-registered for coronaviruses. To make a bleach solution at home, add 1 tablespoon of bleach to 1 quart (4 cups) of water. For a larger supply, add  cup of bleach to 1 gallon (16 cups) of water. Read labels of cleaning products and follow recommendations provided on product labels. Labels contain instructions for safe and effective use of the cleaning product including precautions you should take when applying the product, such as wearing gloves or eye protection and making sure you have good ventilation during use of the product. Remove gloves and wash hands immediately after cleaning.  Monitor yourself for signs and symptoms of illness Caregivers and household members are considered close contacts, should monitor their health, and will be asked to limit movement outside of the home to the extent possible. Follow the monitoring steps for close contacts listed on the symptom monitoring form.   ? If you have additional questions, contact your local health department or call the epidemiologist on call at [phone] (available 24/7). ? This guidance is subject to change. For the most up-to-date guidance from [facility], please refer to their website: [email or web address]

## 2019-11-04 NOTE — Clinical Social Work Note (Signed)
Transition of Care [facility]) - Emergency Department Mini Assessment  Patient Details  Name: Bryan Silva MRN: [id number] Date of Birth: November 14, 1985  Transition of Care [facility]) CM/SW Contact:    Ewing Schlein, LCSW Phone Number: 11/04/2019, 11:35 AM  Clinical Narrative: Patient is a 34 year old male who presented to the ED for cough, vomiting, and requesting a COVID test. Per chart review, patient does not have insurance or a PCP. CSW called patient to discuss referrals to [facility] to assist with getting a PCP and a referral to the financial counselor to see if patient is eligible for Medicaid or another financial assistance program. Patient agreeable to both referrals. CSW made referral to [facility]. CSW made referral to financial counselor, Jerene Dilling. TOC signing off.  ED Mini Assessment: What brought you to the Emergency Department? : Cough, vomiting, requested COVID test Barriers to Discharge: ED Barriers Resolved Barrier interventions: Referrals made to [facility] and financial counselor Means of departure: Car Interventions which prevented an admission or readmission: Other (must enter comment) (Referrals to [facility] and financial counselor)  Patient Contact and Communications Key Contact 1: [facility] Key Contact 2: Financial counselor Jerene Dilling) Contact Date: 11/04/19     Call outcome: Referrals made  Admission diagnosis:  COUGH,VOMITING-WANTS COVID TEST There are no problems to display for this patient.  PCP:  Patient, No Pcp Per Pharmacy:   [facility] [address] ([address]), [place] - [address]) [place] [address] Phone: [phone] Fax: [phone]  [id number] - [place], [place] - [address] AT [facility] [address] [place] [address] Phone: [phone] Fax: [phone]

## 2020-01-18 IMAGING — CT CT ABD-PELV W/ CM
2 of 4 series · 15 of 46 positions shown, 17 images · IV contrast (APPLIED)
Comparison: None.

CLINICAL DATA: Acute onset of BILATERAL LOWER QUADRANT abdominal
pain, diarrhea and vomiting which began this morning.

EXAM:
CT ABDOMEN AND PELVIS WITH CONTRAST
TECHNIQUE: Multidetector CT imaging of the abdomen and pelvis was performed
using the standard protocol following bolus administration of
intravenous contrast.
CONTRAST:  100mL OMNIPAQUE IOHEXOL 300 MG/ML IV. Oral contrast was
also administered.

[Series 2: axial st · axial · 0.64mm/px · z∈[-961,-571]mm · 12 of 86 slices shown, 14 images]
[im 4/86  soft-tissue]
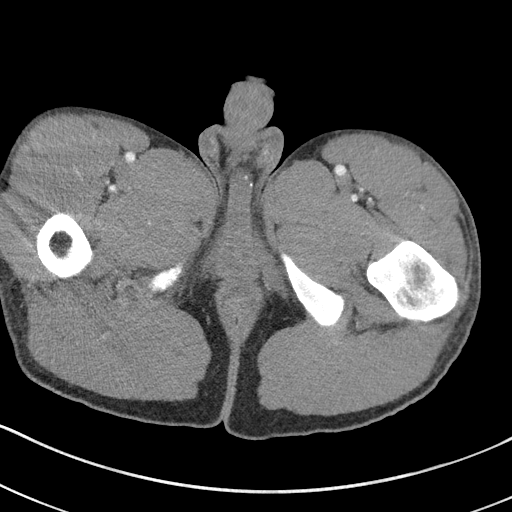
[im 4/86  bone]
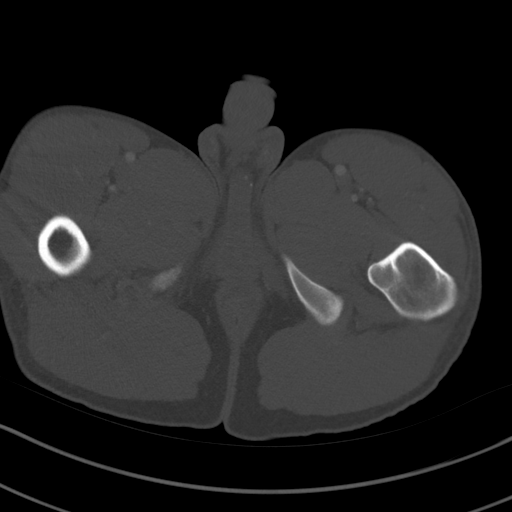
[im 11/86  soft-tissue]
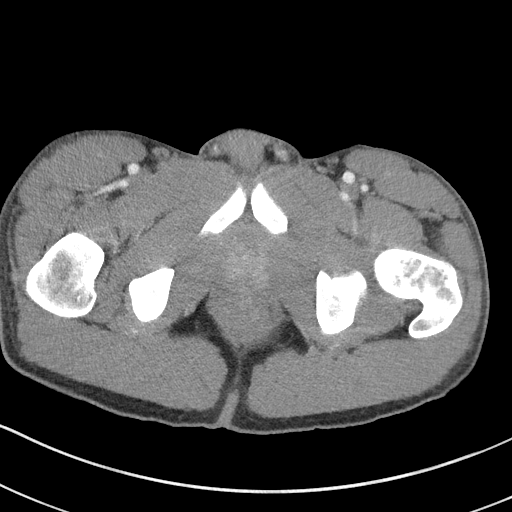
[im 18/86  soft-tissue]
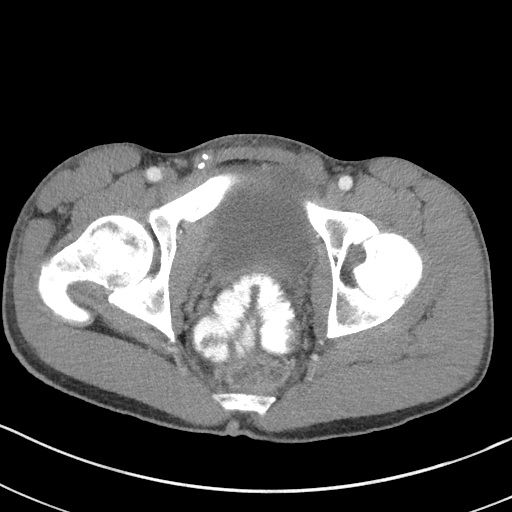
[im 25/86  soft-tissue]
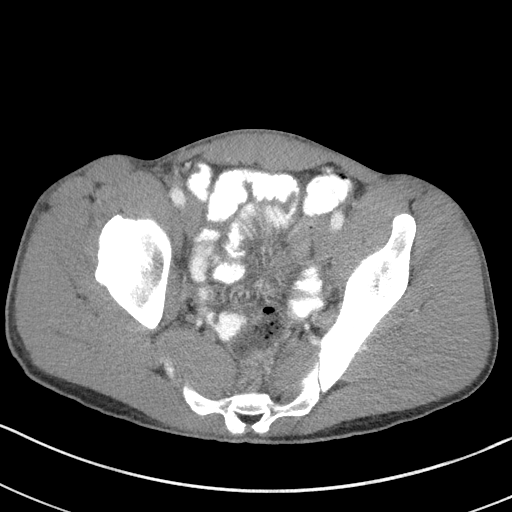
[im 32/86  soft-tissue]
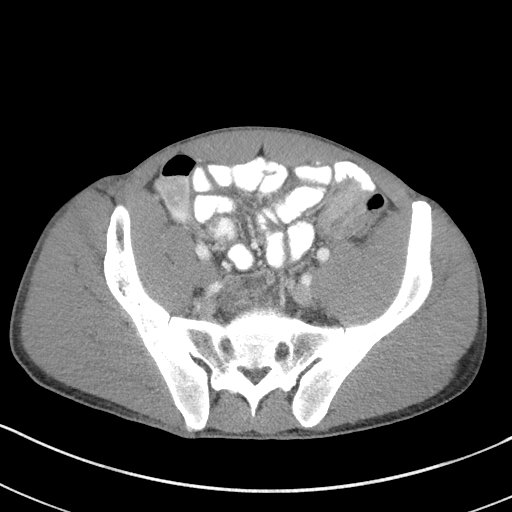
[im 39/86  soft-tissue]
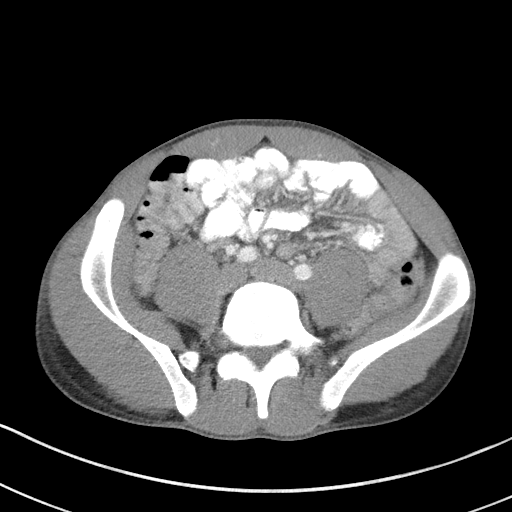
[im 47/86  soft-tissue]
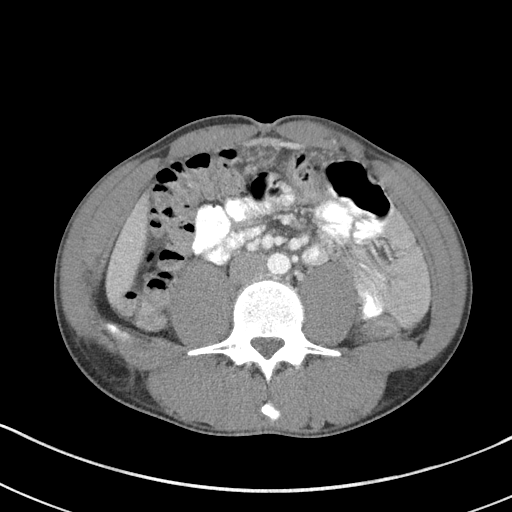
[im 54/86  soft-tissue]
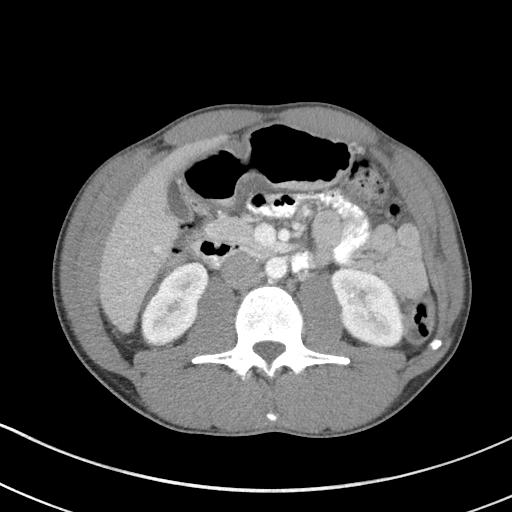
[im 61/86  soft-tissue]
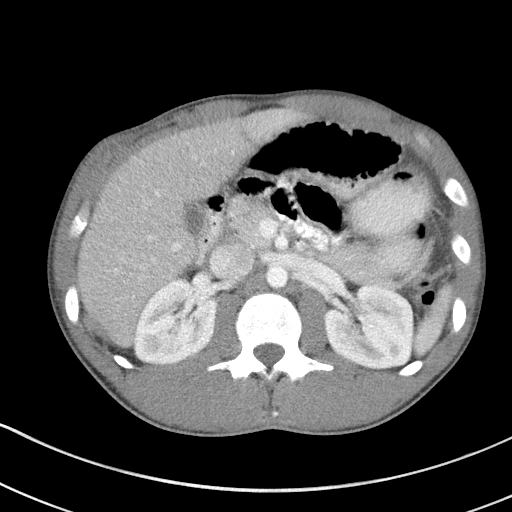
[im 61/86  bone]
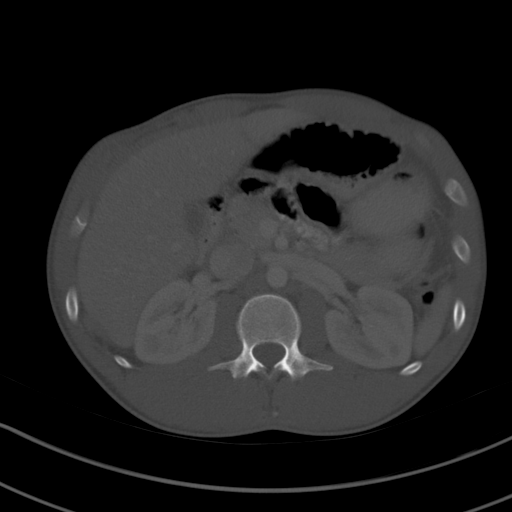
[im 68/86  soft-tissue]
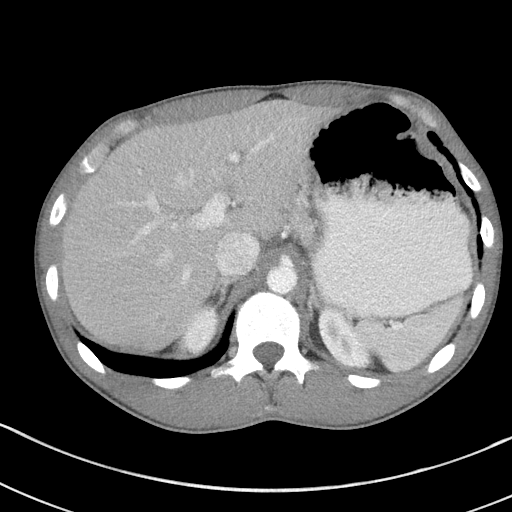
[im 75/86  soft-tissue]
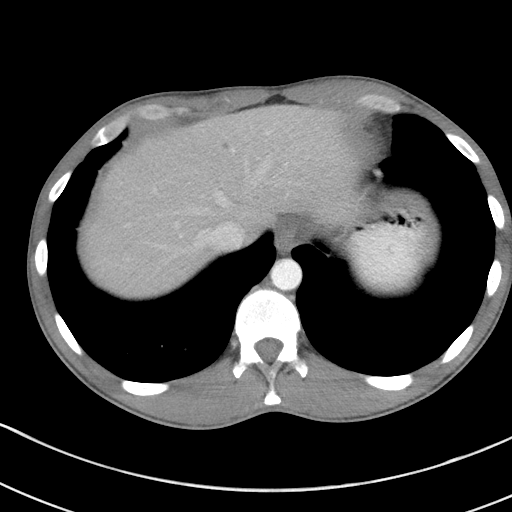
[im 82/86  soft-tissue]
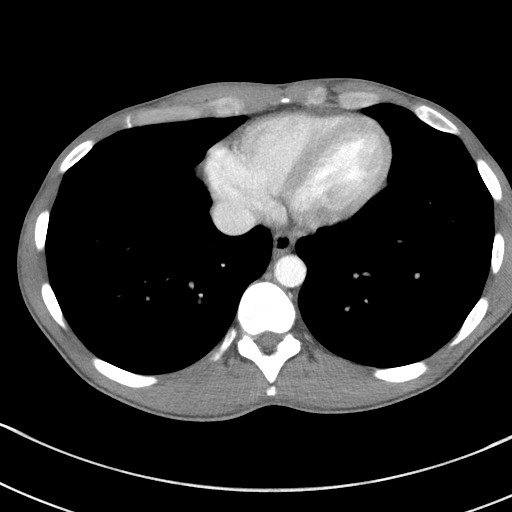

[Series 5: coronal st · coronal · 0.68mm/px · 3 of 78 slices shown]
[im 26/78  soft-tissue]
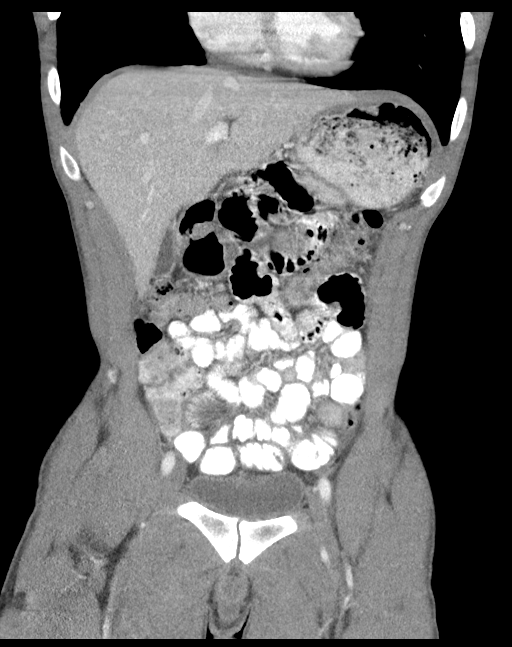
[im 35/78  soft-tissue]
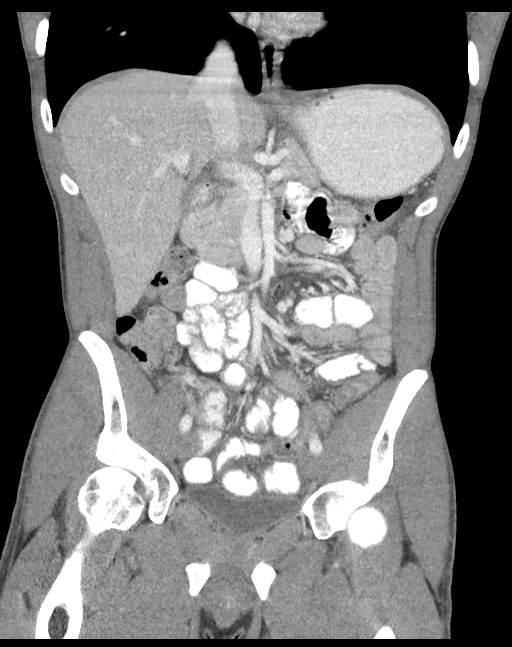
[im 43/78  soft-tissue]
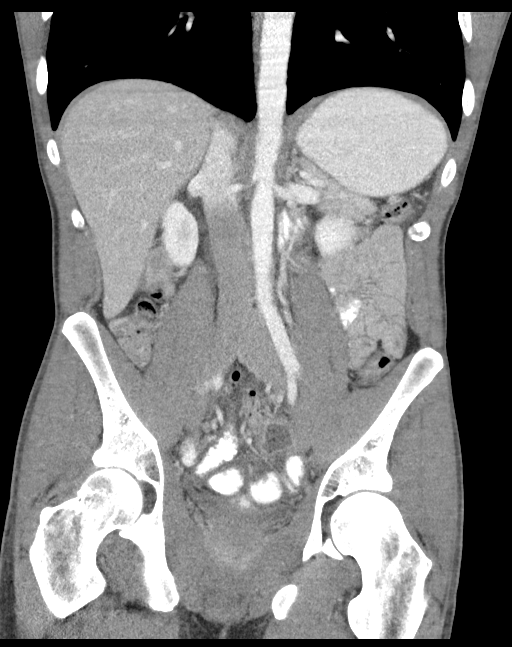

[15 of 46 positions shown; findings below may reference images not displayed]

FINDINGS: Lower chest: Approximate 2.1 x 1.4 cm pure ground-glass nodular
opacity in the MEDIAL LEFT LOWER LOBE. Visualized lung bases
otherwise clear. Heart size normal.

Hepatobiliary: Liver normal in size and appearance. Gallbladder
normal in appearance without calcified gallstones. No biliary ductal
dilation.

Pancreas: Normal in appearance without evidence of mass, ductal
dilation, or inflammation.

Spleen: Normal in size and appearance.

Adrenals/Urinary Tract: Normal appearing adrenal glands. Kidneys
normal in size and appearance without focal parenchymal abnormality.
No evidence of urinary tract calculi or obstruction.
Normal-appearing urinary bladder.

Stomach/Bowel: Stomach normal in appearance for the degree of
distention. Normal-appearing small bowel. Predominantly liquid stool
throughout the decompressed colon which is normal in appearance.
Normal appendix in the RIGHT mid pelvis immediately ANTERIOR to the
RIGHT psoas muscle..

Vascular/Lymphatic: No visible aortoiliofemoral atherosclerosis.
Widely patent visceral arteries. Normal-appearing portal venous and
systemic venous systems.

No pathologic lymphadenopathy.

Reproductive: Normal-appearing prostate gland and seminal vesicles.

Other: None.

Musculoskeletal: Disc space narrowing and associated marked endplate
sclerosis at L5-S1 with central disc protrusion. No acute findings.
IMPRESSION: 1. No acute abnormalities involving the abdomen or pelvis.
2. Approximate 2.1 x 1.4 cm pure ground-glass nodular opacity in the
MEDIAL LEFT LOWER LOBE. While this is likely inflammatory,
adenocarcinoma can have a similar presentation. Initial follow-up
with CT at 6-12 months is recommended to confirm persistence. If
persistent, repeat CT is recommended every 2 years until 5 years of
stability has been established. This recommendation follows the
consensus statement: Guidelines for Management of Incidental
Pulmonary Nodules Detected on CT Images: From the [HOSPITAL]

## 2021-11-06 ENCOUNTER — Encounter (HOSPITAL_COMMUNITY): Payer: Self-pay

## 2021-11-06 ENCOUNTER — Emergency Department (HOSPITAL_COMMUNITY): Payer: Self-pay

## 2021-11-06 ENCOUNTER — Other Ambulatory Visit: Payer: Self-pay

## 2021-11-06 ENCOUNTER — Emergency Department (HOSPITAL_COMMUNITY)
Admission: EM | Admit: 2021-11-06 | Discharge: 2021-11-06 | Disposition: A | Discharge status: Home / Self Care | Payer: Self-pay | Attending: Emergency Medicine | Admitting: Emergency Medicine

## 2021-11-06 DIAGNOSIS — X58XXXA Exposure to other specified factors, initial encounter: Secondary | ICD-10-CM | POA: Insufficient documentation

## 2021-11-06 DIAGNOSIS — Y9389 Activity, other specified: Secondary | ICD-10-CM | POA: Insufficient documentation

## 2021-11-06 DIAGNOSIS — S6991XA Unspecified injury of right wrist, hand and finger(s), initial encounter: Secondary | ICD-10-CM | POA: Insufficient documentation

## 2021-11-06 DIAGNOSIS — S299XXA Unspecified injury of thorax, initial encounter: Secondary | ICD-10-CM | POA: Insufficient documentation

## 2021-11-06 MED ORDER — METHOCARBAMOL 500 MG PO TABS: 500.0000 mg | ORAL_TABLET | Freq: Every day | ORAL | 0 refills | Status: DC

## 2021-11-06 MED ORDER — IBUPROFEN 400 MG PO TABS
600.0000 mg | ORAL_TABLET | Freq: Once | ORAL | Status: AC
  Administered 2021-11-06: 600 mg via ORAL
  Filled 2021-11-06: qty 2

## 2021-11-06 MED ORDER — METHOCARBAMOL 500 MG PO TABS: 500.0000 mg | ORAL_TABLET | Freq: Every day | ORAL | 0 refills | Status: AC

## 2021-11-06 NOTE — ED Provider Notes (Signed)
[facility] EMERGENCY DEPARTMENT Provider Note   CSN: [id number] Arrival date & time: 11/06/21  1314     History  Chief Complaint  Patient presents with   Rib Injury   Finger Injury    SOO RALL is a 36 y.o. male who presents to the emergency department complaining of left lateral rib injury onset 3 days ago.  Notes that he was playing with his cousin when he landed on the side wrong.  Also notes right ring finger pain as well.  No meds tried prior to arrival.  Denies shortness of breath, swelling, redness, drainage, fever.  The history is provided by the patient. No language interpreter was used.       Home Medications Prior to Admission medications   Medication Sig Start Date End Date Taking? Authorizing Provider  cyclobenzaprine (FLEXERIL) 5 MG tablet Take 1 tablet (5 mg total) by mouth 3 (three) times daily as needed for muscle spasms. 05/10/16   Evalee Jefferson, PA-C  loperamide (IMODIUM A-D) 2 MG tablet Take 1 tablet (2 mg total) by mouth 4 (four) times daily as needed for diarrhea or loose stools. 10/07/17   Harvest Dark, MD  methocarbamol (ROBAXIN) 500 MG tablet Take 1 tablet (500 mg total) by mouth daily. 11/06/21   Kuulei Kleier A, PA-C  ondansetron (ZOFRAN ODT) 4 MG disintegrating tablet Take 1 tablet (4 mg total) by mouth every 8 (eight) hours as needed for nausea or vomiting. 10/07/17   Harvest Dark, MD  promethazine (PHENERGAN) 25 MG tablet Take 1 tablet (25 mg total) by mouth every 8 (eight) hours as needed for nausea or vomiting. 07/09/18   Kennyth Arnold, FNP      Allergies    Patient has no known allergies.    Review of Systems   Review of Systems  Constitutional:  Negative for fever.  Musculoskeletal:  Positive for arthralgias and joint swelling.  Skin:  Negative for color change and wound.  All other systems reviewed and are negative.   Physical Exam Updated Vital Signs BP 109/70   Pulse 66   Temp 98 F (36.7 C) (Oral)   Resp 16   Ht  6' (1.829 m)   Wt 74.2 kg   SpO2 99%   BMI 22.17 kg/m  Physical Exam Vitals and nursing note reviewed.  Constitutional:      General: He is not in acute distress.    Appearance: Normal appearance.  Eyes:     General: No scleral icterus.    Extraocular Movements: Extraocular movements intact.  Cardiovascular:     Rate and Rhythm: Normal rate.  Pulmonary:     Effort: Pulmonary effort is normal. No respiratory distress.  Musculoskeletal:     Cervical back: Neck supple.     Comments: Tenderness to palpation to PIP joint of right ring finger. No obvious deformity, effusion, erythema, or swelling.  Decreased range of motion of right ring finger secondary to pain.  Able to flex and extend ring finger against resistance.  Finger to thumb opposition intact.  Mild tenderness to palpation noted to left lateral ribs without obvious deformity, crepitus, step-off.  No overlying skin changes.    Skin:    General: Skin is warm and dry.     Findings: No bruising, erythema or rash.  Neurological:     Mental Status: He is alert.  Psychiatric:        Behavior: Behavior normal.     ED Results / Procedures / Treatments   Labs (  all labs ordered are listed, but only abnormal results are displayed) Labs Reviewed - No data to display  EKG None  Radiology DG Ribs Unilateral W/Chest Left  Result Date: 11/06/2021 CLINICAL DATA:  Patient complaining of left posterior rib pain and right hand/ ring finger pain. States that he was wrestling with cousin this weekend and landed on side wrong EXAM: LEFT RIBS AND CHEST - 3+ VIEW COMPARISON:  Chest radiograph 06/01/2015 is FINDINGS: No displaced fracture or other bone lesions are seen involving the ribs. There is no evidence of pneumothorax or pleural effusion. Both lungs are clear. Heart size and mediastinal contours are within normal limits. IMPRESSION: Negative. Electronically Signed   By: Emmaline Kluver M.D.   On: 11/06/2021 14:01   DG Hand Complete  Right  Result Date: 11/06/2021 CLINICAL DATA:  Patient complaining of left posterior rib pain and right hand/ ring finger pain. States that he was wrestling with cousin this weekend and landed on side wrong EXAM: RIGHT HAND - COMPLETE 3+ VIEW COMPARISON:  None Available. FINDINGS: There is no evidence of fracture or dislocation. There is no evidence of arthropathy or other focal bone abnormality. Soft tissues are unremarkable. IMPRESSION: Negative. Electronically Signed   By: Emmaline Kluver M.D.   On: 11/06/2021 13:59    Procedures Procedures    Medications Ordered in ED Medications  ibuprofen (ADVIL) tablet 600 mg (has no administration in time range)    ED Course/ Medical Decision Making/ A&P                           Medical Decision Making Amount and/or Complexity of Data Reviewed Radiology: ordered.   Pt presents with concerns for left rib pain and right ring finger onset 3 days ago after playing with his cousin.  Vital signs stable, patient afebrile, not tachycardic or hypoxic.  On exam patient with Tenderness to palpation to PIP joint of right ring finger. No obvious deformity, effusion, erythema, or swelling.  Decreased range of motion of right ring finger secondary to pain.  Able to flex and extend ring finger against resistance.  Finger to thumb opposition intact.  Mild tenderness to palpation noted to left lateral ribs without obvious deformity, crepitus, step-off.  No overlying skin changes.  Differential diagnosis includes fracture, dislocation, contusion, strain, pneumothorax.   Imaging: I ordered imaging studies including right finger xray, left rib xray I independently visualized and interpreted imaging which showed: No acute fracture or dislocation I agree with the radiologist interpretation  Medications:  I ordered medication including ibuprofen, ice, heat for symptom management Reevaluation of the patient after these medicines and interventions, I reevaluated the  patient and found that they have improved I have reviewed the patients home medicines and have made adjustments as needed  Disposition: Presentation suspicious for injury and contusion of right ring finger.  Also concerning for strain of left thoracic musculature.  Doubt fracture, dislocation, pneumothorax at this time.  After consideration of the diagnostic results and the patients response to treatment, I feel that the patient would benefit from Discharge home.  Patient will be discharged home with a short course of Robaxin.  Work note provided.  Supportive care measures and strict return precautions discussed with patient at bedside. Pt acknowledges and verbalizes understanding. Pt appears safe for discharge. Follow up as indicated in discharge paperwork.    This chart was dictated using voice recognition software, Dragon. Despite the best efforts of this provider to proofread  and correct errors, errors may still occur which can change documentation meaning.  Final Clinical Impression(s) / ED Diagnoses Final diagnoses:  Injury of finger of right hand, initial encounter  Rib injury    Rx / DC Orders ED Discharge Orders          Ordered    methocarbamol (ROBAXIN) 500 MG tablet  Daily,   Status:  Discontinued        11/06/21 1514    methocarbamol (ROBAXIN) 500 MG tablet  Daily        11/06/21 1514              Hamza Empson A, PA-C 11/06/21 1519    Mancel Bale, MD 11/07/21 1138

## 2021-11-06 NOTE — ED Triage Notes (Signed)
Patient complaining of left rib pain and right and pain. States that he was playing with cousin this weekend and landed on side wrong.

## 2021-11-06 NOTE — ED Notes (Signed)
Finger splint applied to R ring finger

## 2021-11-06 NOTE — Discharge Instructions (Addendum)
It was a pleasure taking care of you today!  Your x-ray was negative for fracture or dislocation.  You will be sent home with a finger splint, you may wear it during the day and remove it at night.  You may place ice to the affected area about 15 minutes at a time, sure to place a barrier between your skin and the ice.  You will be sent a prescription for Robaxin, do not drive or operate heavy machinery while taking this medication as it can make you sleepy.  You may take over-the-counter 6 oh milligrams ibuprofen every 6 hours or 500 mg Tylenol every 6 hours as needed for pain for no more than 7 days.  Ensure that you are taking deep breaths throughout the day as well. You may follow-up with your primary care provider as needed.  Return to the emergency department for experiencing increasing/worsening trouble breathing, pain, drainage, fever, worsening symptoms.

## 2023-10-22 ENCOUNTER — Other Ambulatory Visit: Payer: Self-pay

## 2023-10-22 ENCOUNTER — Encounter (HOSPITAL_COMMUNITY): Payer: Self-pay

## 2023-10-22 ENCOUNTER — Emergency Department (HOSPITAL_COMMUNITY)
Admission: EM | Admit: 2023-10-22 | Discharge: 2023-10-22 | Disposition: A | Discharge status: Home / Self Care | Payer: Self-pay | Source: Ambulatory Visit | Attending: Emergency Medicine | Admitting: Emergency Medicine

## 2023-10-22 ENCOUNTER — Emergency Department (HOSPITAL_COMMUNITY): Payer: Self-pay

## 2023-10-22 DIAGNOSIS — T7840XA Allergy, unspecified, initial encounter: Secondary | ICD-10-CM | POA: Insufficient documentation

## 2023-10-22 MED ORDER — PREDNISONE 10 MG PO TABS: 40.0000 mg | ORAL_TABLET | Freq: Every day | ORAL | 0 refills | Status: AC

## 2023-10-22 MED ORDER — SODIUM CHLORIDE 0.9 % IV BOLUS
1000.0000 mL | Freq: Once | INTRAVENOUS | Status: AC
  Administered 2023-10-22: 1000 mL via INTRAVENOUS

## 2023-10-22 MED ORDER — IPRATROPIUM-ALBUTEROL 0.5-2.5 (3) MG/3ML IN SOLN
3.0000 mL | Freq: Once | RESPIRATORY_TRACT | Status: AC
  Administered 2023-10-22: 3 mL via RESPIRATORY_TRACT
  Filled 2023-10-22: qty 3

## 2023-10-22 MED ORDER — EPINEPHRINE 0.3 MG/0.3ML IJ SOAJ: 0.3000 mg | INTRAMUSCULAR | 0 refills | Status: DC | PRN

## 2023-10-22 MED ORDER — PREDNISONE 10 MG PO TABS: 40.0000 mg | ORAL_TABLET | Freq: Every day | ORAL | 0 refills | Status: DC

## 2023-10-22 MED ORDER — EPINEPHRINE 0.3 MG/0.3ML IJ SOAJ: 0.3000 mg | INTRAMUSCULAR | 0 refills | Status: AC | PRN

## 2023-10-22 MED ORDER — FAMOTIDINE IN NACL 20-0.9 MG/50ML-% IV SOLN
20.0000 mg | Freq: Once | INTRAVENOUS | Status: AC
  Administered 2023-10-22: 20 mg via INTRAVENOUS
  Filled 2023-10-22: qty 50

## 2023-10-22 MED ORDER — KETOROLAC TROMETHAMINE 15 MG/ML IJ SOLN
15.0000 mg | Freq: Once | INTRAMUSCULAR | Status: AC
  Administered 2023-10-22: 15 mg via INTRAVENOUS
  Filled 2023-10-22: qty 1

## 2023-10-22 NOTE — ED Provider Notes (Signed)
  Physical Exam  BP 115/67   Pulse 73   Resp 11   Ht 6' (1.829 m)   Wt 74.2 kg   SpO2 100%   BMI 22.19 kg/m   Physical Exam  Procedures  Procedures  ED Course / MDM    Medical Decision Making Amount and/or Complexity of Data Reviewed Radiology: ordered.  Risk Prescription drug management.  Patient was sent in for anaphylaxis from urgent care after giving steroids Benadryl and epinephrine .  Patient is doing better, also received Pepcid , IV fluids and Toradol  here as well as a nebulizer treatment.  Will monitor for 2 more hours to ensure he has not had rebound reaction.  Will send home with EpiPen .  On recheck, patient has no current symptoms, instructed him EpiPen  use, steroids for home follow-up and return precautions.        Suellen Sherran DELENA DEVONNA 10/22/23 2055    Suzette Pac, MD 10/24/23 1515

## 2023-10-22 NOTE — ED Provider Notes (Signed)
 [facility] EMERGENCY DEPARTMENT AT [facility] Provider Note   CSN: [id number] Arrival date & time: 10/22/23  1649     Patient presents with: Allergic Reaction   Bryan Silva is a 38 y.o. male.   Patient is a 38 year old male who presents emergency department secondary to an allergic reaction from a bee sting.  Patient notes that earlier today he was stung by bee over the right eye, along his throat, and along his lower extremity.  He was evaluated in urgent care during which time he was given epinephrine , Solu-Medrol and Benadryl.  He notes that symptoms have improved on presentation.  He was initially having shortness of breath which has resolved.  He notes that his pruritus has resolved as well.  He denies any known history of any other allergies and denies any known allergies to bee stings.  He denies any active abdominal pain, nausea, vomiting, diarrhea.   Allergic Reaction      Prior to Admission medications   Medication Sig Start Date End Date Taking? Authorizing Provider  cyclobenzaprine  (FLEXERIL ) 5 MG tablet Take 1 tablet (5 mg total) by mouth 3 (three) times daily as needed for muscle spasms. 05/10/16   Idol, Julie, PA-C  loperamide  (IMODIUM  A-D) 2 MG tablet Take 1 tablet (2 mg total) by mouth 4 (four) times daily as needed for diarrhea or loose stools. 10/07/17   Dorothyann Drivers, MD  methocarbamol  (ROBAXIN ) 500 MG tablet Take 1 tablet (500 mg total) by mouth daily. 11/06/21   Blue, Soijett A, PA-C  ondansetron  (ZOFRAN  ODT) 4 MG disintegrating tablet Take 1 tablet (4 mg total) by mouth every 8 (eight) hours as needed for nausea or vomiting. 10/07/17   Dorothyann Drivers, MD  promethazine  (PHENERGAN ) 25 MG tablet Take 1 tablet (25 mg total) by mouth every 8 (eight) hours as needed for nausea or vomiting. 07/09/18   Webb, Padonda B, FNP    Allergies: Patient has no known allergies.    Review of Systems  Respiratory:  Positive for shortness of breath.   All  other systems reviewed and are negative.   Updated Vital Signs BP 115/67   Pulse 73   Resp 11   Ht 6' (1.829 m)   Wt 74.2 kg   SpO2 100%   BMI 22.19 kg/m   Physical Exam Vitals and nursing note reviewed.  Constitutional:      Appearance: Normal appearance.  HENT:     Head: Normocephalic and atraumatic.     Nose: Nose normal.     Mouth/Throat:     Mouth: Mucous membranes are moist.  Eyes:     Extraocular Movements: Extraocular movements intact.     Conjunctiva/sclera: Conjunctivae normal.     Pupils: Pupils are equal, round, and reactive to light.  Cardiovascular:     Rate and Rhythm: Normal rate and regular rhythm.     Pulses: Normal pulses.     Heart sounds: Normal heart sounds. No murmur heard.    No gallop.  Pulmonary:     Effort: Pulmonary effort is normal. No respiratory distress.     Breath sounds: Normal breath sounds. No stridor. No wheezing, rhonchi or rales.  Abdominal:     General: Abdomen is flat. Bowel sounds are normal. There is no distension.     Palpations: Abdomen is soft.     Tenderness: There is no abdominal tenderness. There is no guarding.  Musculoskeletal:        General: Normal range of motion.  Cervical back: Normal range of motion and neck supple. No rigidity or tenderness.  Skin:    General: Skin is warm and dry.     Findings: No bruising or rash.  Neurological:     General: No focal deficit present.     Mental Status: He is alert and oriented to person, place, and time. Mental status is at baseline.  Psychiatric:        Mood and Affect: Mood normal.        Behavior: Behavior normal.        Thought Content: Thought content normal.        Judgment: Judgment normal.     (all labs ordered are listed, but only abnormal results are displayed) Labs Reviewed - No data to display  EKG: None  Radiology: [facility] Chest Port 1 View Result Date: 10/22/2023 CLINICAL DATA:  Dyspnea. EXAM: PORTABLE CHEST 1 VIEW COMPARISON:  November 06, 2021.  FINDINGS: The heart size and mediastinal contours are within normal limits. Both lungs are clear. The visualized skeletal structures are unremarkable. IMPRESSION: No active disease. Electronically Signed   By: Lynwood Landy Raddle M.D.   On: 10/22/2023 17:27     Procedures   Medications Ordered in the ED  famotidine  (PEPCID ) IVPB 20 mg premix (20 mg Intravenous New Bag/Given 10/22/23 1737)  ketorolac  (TORADOL ) 15 MG/ML injection 15 mg (15 mg Intravenous Given 10/22/23 1736)  sodium chloride  0.9 % bolus 1,000 mL (1,000 mLs Intravenous New Bag/Given 10/22/23 1735)  ipratropium-albuterol  (DUONEB) 0.5-2.5 (3) MG/3ML nebulizer solution 3 mL (3 mLs Nebulization Given 10/22/23 1709)                                    Medical Decision Making Patient does remain stable at this time and symptoms have completely resolved.  He was given a dose of Pepcid  and IV fluids in the emergency department.  He remains asymptomatic at this point.  Chest x-ray demonstrated no acute changes.  He has had no chest pain or shortness of breath prior to the bee sting.  He will require continued monitoring as he did receive epinephrine . Will sign patient out to [facility], PA-C at 1900 on 10/22/23.  Amount and/or Complexity of Data Reviewed Radiology: ordered.  Risk Prescription drug management.        Final diagnoses:  None    ED Discharge Orders     None          Daralene Lonni JONETTA DEVONNA 10/22/23 SULEMA Suzette Pac, MD 10/24/23 1515

## 2023-10-22 NOTE — ED Triage Notes (Signed)
 Pt BIB ems from UC for multiple wasp stings. Pt was having an anaphylaxis reaction. Pt was administered solu-medrol IM, epi IM, and administered PO Benadryl and given duo-neb.

## 2023-10-22 NOTE — Discharge Instructions (Addendum)
 Seen today for a bad allergic reaction due to bee stings.  You were given epinephrine  and steroids at the urgent care as well as Benadryl.  We gave you a breathing treatment, and Pepcid  which is another antihistamine.  We are glad you are feeling better, we observed you to make sure your allergic reaction did not worsen.  We are prescribing an EpiPen .  Is important to carry this with you, only use in case of severe allergic reaction causing trouble breathing, swelling of your lips throat or tongue, dizziness, rather than for more mild allergic reaction such as itching or rash only.  If you need to use this you need to come back to the ER.  You are also prescribed prednisone  to take daily for the next 5 days to prevent recurrence of your symptoms.  Come back to the ER if you have new or worsening symptoms.

## 2023-10-22 NOTE — ED Notes (Signed)
 Pt ambulated to bathroom

## 2023-11-04 DIAGNOSIS — Z419 Encounter for procedure for purposes other than remedying health state, unspecified: Secondary | ICD-10-CM | POA: Diagnosis not present

## 2023-12-05 DIAGNOSIS — Z419 Encounter for procedure for purposes other than remedying health state, unspecified: Secondary | ICD-10-CM | POA: Diagnosis not present
